# Patient Record
Sex: Female | Born: 1985 | Race: Black or African American | Hispanic: No | Marital: Married | Smoking: Never smoker
Health system: Southern US, Community
[De-identification: ages and names within clinical notes are randomized; demographics above are authoritative.]

## PROBLEM LIST (undated history)

## (undated) DIAGNOSIS — N760 Acute vaginitis: Secondary | ICD-10-CM

## (undated) DIAGNOSIS — B373 Candidiasis of vulva and vagina: Secondary | ICD-10-CM

## (undated) DIAGNOSIS — F419 Anxiety disorder, unspecified: Secondary | ICD-10-CM

## (undated) DIAGNOSIS — B3731 Acute candidiasis of vulva and vagina: Secondary | ICD-10-CM

## (undated) DIAGNOSIS — B9689 Other specified bacterial agents as the cause of diseases classified elsewhere: Secondary | ICD-10-CM

## (undated) DIAGNOSIS — F909 Attention-deficit hyperactivity disorder, unspecified type: Secondary | ICD-10-CM

## (undated) DIAGNOSIS — F319 Bipolar disorder, unspecified: Secondary | ICD-10-CM

## (undated) HISTORY — DX: Candidiasis of vulva and vagina: B37.3

## (undated) HISTORY — DX: Anxiety disorder, unspecified: F41.9

## (undated) HISTORY — DX: Acute candidiasis of vulva and vagina: B37.31

## (undated) HISTORY — DX: Acute vaginitis: N76.0

## (undated) HISTORY — DX: Attention-deficit hyperactivity disorder, unspecified type: F90.9

## (undated) HISTORY — DX: Other specified bacterial agents as the cause of diseases classified elsewhere: B96.89

## (undated) HISTORY — DX: Bipolar disorder, unspecified: F31.9

---

## 2007-03-07 DIAGNOSIS — O24419 Gestational diabetes mellitus in pregnancy, unspecified control: Secondary | ICD-10-CM

## 2012-10-07 DIAGNOSIS — Z91030 Bee allergy status: Secondary | ICD-10-CM | POA: Insufficient documentation

## 2013-02-11 ENCOUNTER — Ambulatory Visit: Payer: Self-pay | Admitting: Family Medicine

## 2013-03-21 DIAGNOSIS — M79602 Pain in left arm: Secondary | ICD-10-CM | POA: Insufficient documentation

## 2014-12-23 ENCOUNTER — Emergency Department
Admission: EM | Admit: 2014-12-23 | Discharge: 2014-12-23 | Disposition: A | Discharge status: Home / Self Care | Payer: Worker's Compensation | Attending: Emergency Medicine | Admitting: Emergency Medicine

## 2014-12-23 ENCOUNTER — Encounter: Payer: Self-pay | Admitting: Emergency Medicine

## 2014-12-23 ENCOUNTER — Other Ambulatory Visit: Payer: Self-pay

## 2014-12-23 DIAGNOSIS — T63441A Toxic effect of venom of bees, accidental (unintentional), initial encounter: Secondary | ICD-10-CM | POA: Insufficient documentation

## 2014-12-23 DIAGNOSIS — Y9289 Other specified places as the place of occurrence of the external cause: Secondary | ICD-10-CM | POA: Insufficient documentation

## 2014-12-23 DIAGNOSIS — T7840XA Allergy, unspecified, initial encounter: Secondary | ICD-10-CM

## 2014-12-23 DIAGNOSIS — Y9389 Activity, other specified: Secondary | ICD-10-CM | POA: Insufficient documentation

## 2014-12-23 DIAGNOSIS — Y990 Civilian activity done for income or pay: Secondary | ICD-10-CM | POA: Diagnosis not present

## 2014-12-23 DIAGNOSIS — T63444A Toxic effect of venom of bees, undetermined, initial encounter: Secondary | ICD-10-CM

## 2014-12-23 MED ORDER — FAMOTIDINE IN NACL 20-0.9 MG/50ML-% IV SOLN
INTRAVENOUS | Status: AC
  Administered 2014-12-23: 20 mg via INTRAVENOUS
  Filled 2014-12-23: qty 50

## 2014-12-23 MED ORDER — PREDNISONE 20 MG PO TABS: ORAL_TABLET | ORAL | Status: DC

## 2014-12-23 MED ORDER — METHYLPREDNISOLONE SODIUM SUCC 125 MG IJ SOLR
INTRAMUSCULAR | Status: AC
  Administered 2014-12-23: 60 mg via INTRAVENOUS
  Filled 2014-12-23: qty 2

## 2014-12-23 MED ORDER — METHYLPREDNISOLONE SODIUM SUCC 125 MG IJ SOLR
60.0000 mg | Freq: Once | INTRAMUSCULAR | Status: AC
  Administered 2014-12-23: 60 mg via INTRAVENOUS

## 2014-12-23 MED ORDER — DIPHENHYDRAMINE HCL 50 MG/ML IJ SOLN
25.0000 mg | Freq: Once | INTRAMUSCULAR | Status: AC
  Administered 2014-12-23: 25 mg via INTRAVENOUS

## 2014-12-23 MED ORDER — DIPHENHYDRAMINE HCL 50 MG/ML IJ SOLN
INTRAMUSCULAR | Status: AC
  Administered 2014-12-23: 25 mg via INTRAVENOUS
  Filled 2014-12-23: qty 1

## 2014-12-23 MED ORDER — FAMOTIDINE IN NACL 20-0.9 MG/50ML-% IV SOLN
20.0000 mg | Freq: Once | INTRAVENOUS | Status: AC
  Administered 2014-12-23: 20 mg via INTRAVENOUS

## 2014-12-23 NOTE — ED Notes (Signed)
Pt reports increased in itching; MD notified, verbal orders given for 25mg  IV benadryl, 20mg  IVPB famotidine and 60mg  IV solumedrol.

## 2014-12-23 NOTE — ED Provider Notes (Signed)
[facility] Emergency Department Provider Note  ____________________________________________  Time seen:    I have reviewed the triage vital signs and the nursing notes.   HISTORY  Chief Complaint Allergic Reaction  Bee sting   HPI Krista Daniels is a 29 y.o. female was stung by a bee prior to arrival. She was working at a middle school setting. The bee stung her on the medial side of her right knee. 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 This prior reaction involved delayed onset tightness and shortness of breath. When she was treated for that staying she was seen in emergency department and it sounds like she was treated with some steroids but without epinephrine. After that particular event she followed up with another physician who provided her with a prescription for an EpiPen in case she got stung again.  Today, when she was stung, she immediately went inside the school. The school had called 911 and obtained their own EpiPen as the patient's EpiPen was in her car. Within 3 minutes the EpiPen was administered. The patient reports she was anxious and a little bit short of breath before the EpiPen was given, but she was not having more severe shortness of breath, she had no diffuse rash, she was not having difficulty speaking, she is not having difficulty swallowing, she did not feel like she was given a pass out, and she did not feel any chills or rigors.  Afternoon illustration of the EpiPen by school staff, 911 arrived and transported to the emergency department. At this time she still does not have any sort of a rash. She feels a little jittery but otherwise fine.   History reviewed. No pertinent past medical history.  Allergy to bee stings  There are no active problems to display for this patient.   Past Surgical History  Procedure Laterality Date  . Cesarean section  2008, 2010    Current Outpatient Rx  Name  Route  Sig  Dispense  Refill  .  predniSONE (DELTASONE) 20 MG tablet      Take 2 tablets the first day, then take one tablet a day for 4 more days.   6 tablet   0     Allergies Azithromycin; Bee venom; and Hydrocodone  No family history on file.  Social History History  Substance Use Topics  . Smoking status: Never Smoker   . Smokeless tobacco: Not on file  . Alcohol Use: No    Review of Systems  Constitutional: Negative for fever. ENT: Negative for sore throat. Cardiovascular: Negative for chest pain. Respiratory: Negative for shortness of breath. Gastrointestinal: Negative for abdominal pain, vomiting and diarrhea. Genitourinary: Negative for dysuria. Musculoskeletal: No myalgias or injuries. Skin: Bee sting with mild to moderate swelling on the right medial knee. See history of present illness Neurological: Negative for headaches   10-point ROS otherwise negative.  ____________________________________________   PHYSICAL EXAM:  VITAL SIGNS: ED Triage Vitals  Enc Vitals Group     BP 12/23/14 1156 132/59 mmHg     Pulse Rate 12/23/14 1156 76     Resp 12/23/14 1156 18     Temp 12/23/14 1156 97.4 F (36.3 C)     Temp Source 12/23/14 1156 Oral     SpO2 12/23/14 1154 99 %     Weight 12/23/14 1156 125 lb (56.7 kg)     Height 12/23/14 1156  (1.499 m)     Head Cir --  Peak Flow --      Pain Score 12/23/14 1157 0     Pain Loc --      Pain Edu? --      Excl. in GC? --     Constitutional:  Alert and oriented. Well appearing and in no distress. ENT   Head: Normocephalic and atraumatic.   Nose: No congestion/rhinnorhea.   Mouth/Throat: Mucous membranes are moist. Cardiovascular: Normal rate of 75, regular rhythm, no murmur noted Respiratory:  Normal respiratory effort, no tachypnea.    Breath sounds are clear and equal bilaterally.  Gastrointestinal: Soft and nontender. No distention.  Back: No muscle spasm, no tenderness, no CVA tenderness. Musculoskeletal: No deformity  noted. Nontender with normal range of motion in all extremities.  No noted edema. Neurologic:  Normal speech and language. No gross focal neurologic deficits are appreciated.  Skin:  Mild swelling to the medial right knee. Minimal erythema. There is no urticaria or other rash noted.Marland Kitchen Psychiatric: Mood and affect are normal. Speech and behavior are normal.  ____________________________________________   EKG  ED ECG REPORT I, Zinedine Ellner W, the attending physician, personally viewed and interpreted this ECG.   Date: 12/23/2014  EKG Time: 1202  Rate: 75  Rhythm: Normal sinus rhythm  Axis: Normal  Intervals: Normal  ST&T Change: None noted   ____________________________________________   ____________________________________________   INITIAL IMPRESSION / ASSESSMENT AND PLAN / ED COURSE  Pertinent labs & imaging results that were available during my care of the patient were reviewed by me and considered in my medical decision making (see chart for details).   Well-appearing 29 year old female in no acute distress. Bee sting to the right medial knee. Currently no sign of a systemic or anaphylactic reaction. We will treat her with Benadryl and famotidine and observe her in the emergency department.  ----------------------------------------- 1:01 PM on 12/23/2014 -----------------------------------------  Patient is beginning to develop itching diffusely. We will add Solu-Medrol to her treatment regimen.  ----------------------------------------- 3:19 PM on 12/23/2014 -----------------------------------------  Patient appears to be doing well. She is sleeping when I first walked in the room. She awakens appropriately. Her heart rate is normal. She has no ongoing rash. We spoke further about when to use epi.  I will prescribe prednisone for the next few days. She can follow-up with her primary physician, Dr. Greggory Stallion, at [facility] primary  care.  ____________________________________________   FINAL CLINICAL IMPRESSION(S) / ED DIAGNOSES  Final diagnoses:  Bee sting reaction, undetermined intent, initial encounter  Allergic reaction, initial encounter      Darien Ramus, MD 12/23/14 1527

## 2014-12-23 NOTE — ED Notes (Signed)
Pt here from job; was stung by a bee to right inner thigh; EMS reports pt got IM epi at the school. Pt alert and oriented, reports feeling tired.

## 2014-12-23 NOTE — Discharge Instructions (Signed)
You're reaction overall appeared mild today. As we discussed, if he gets stung again use she views epinephrine if you are having signs of it affecting her whole body. This could involve itching all over, shortness of breath, difficulty swallowing, chills, weakness, or other general bad feelings. If in doubt, it is better to use the epinephrine early.  Today, it wasn't clear if he needed to take the epinephrine when he did. Take prednisone as prescribed. He may use more Benadryl today if needed for itching. Follow-up with your regular doctor at [facility] primary care. Return to the emergency department if you have shortness of breath, difficult swallowing, or other urgent concerns.  Anaphylactic Reaction An anaphylactic reaction is a sudden, severe allergic reaction that involves the whole body. It can be life threatening. A hospital stay is often required. People with asthma, eczema, or hay fever are slightly more likely to have an anaphylactic reaction. CAUSES  An anaphylactic reaction may be caused by anything to which you are allergic. After being exposed to the allergic substance, your immune system becomes sensitized to it. When you are exposed to that allergic substance again, an allergic reaction can occur. Common causes of an anaphylactic reaction include:  Medicines.  Foods, especially peanuts, wheat, shellfish, milk, and eggs.  Insect bites or stings.  Blood products.  Chemicals, such as dyes, latex, and contrast material used for imaging tests. SYMPTOMS  When an allergic reaction occurs, the body releases histamine and other substances. These substances cause symptoms such as tightening of the airway. Symptoms often develop within seconds or minutes of exposure. Symptoms may include:  Skin rash or hives.  Itching.  Chest tightness.  Swelling of the eyes, tongue, or lips.  Trouble breathing or swallowing.  Lightheadedness or fainting.  Anxiety or confusion.  Stomach pains,  vomiting, or diarrhea.  Nasal congestion.  A fast or irregular heartbeat (palpitations). DIAGNOSIS  Diagnosis is based on your history of recent exposure to allergic substances, your symptoms, and a physical exam. Your caregiver may also perform blood or urine tests to confirm the diagnosis. TREATMENT  Epinephrine medicine is the main treatment for an anaphylactic reaction. Other medicines that may be used for treatment include antihistamines, steroids, and albuterol. In severe cases, fluids and medicine to support blood pressure may be given through an intravenous line (IV). Even if you improve after treatment, you need to be observed to make sure your condition does not get worse. This may require a stay in the hospital. [place] a medical alert bracelet or necklace stating your allergy.  You and your family must learn how to use an anaphylaxis kit or give an epinephrine injection to temporarily treat an emergency allergic reaction. Always carry your epinephrine injection or anaphylaxis kit with you. This can be lifesaving if you have a severe reaction.  Do not drive or perform tasks after treatment until the medicines used to treat your reaction have worn off, or until your caregiver says it is okay.  If you have hives or a rash:  Take medicines as directed by your caregiver.  You may use an over-the-counter antihistamine (diphenhydramine) as needed.  Apply cold compresses to the skin or take baths in cool water. Avoid hot baths or showers. SEEK MEDICAL CARE IF:   You develop symptoms of an allergic reaction to a new substance. Symptoms may start right away or minutes later.  You develop a rash, hives, or itching.  You develop new symptoms. [place]  CARE IF:   You have swelling of the mouth, difficulty breathing, or wheezing.  You have a tight feeling in your chest or throat.  You develop hives, swelling, or itching all over your body.  You  develop severe vomiting or diarrhea.  You feel faint or pass out. This is an emergency. Use your epinephrine injection or anaphylaxis kit as you have been instructed. Call your local emergency services (911 in [place] Even if you improve after the injection, you need to be examined at a hospital emergency department. MAKE SURE YOU:   Understand these instructions.  Will watch your condition.  Will get help right away if you are not doing well or get worse. Document Released: 06/19/2005 Document Revised: 06/24/2013 Document Reviewed: 09/20/2011 [facility] Patient Information 2015 [place], [place]. This information is not intended to replace advice given to you by your health care provider. Make sure you discuss any questions you have with your health care provider.

## 2014-12-25 ENCOUNTER — Ambulatory Visit
Admission: EM | Admit: 2014-12-25 | Discharge: 2014-12-25 | Disposition: A | Discharge status: Home / Self Care | Payer: Worker's Compensation | Attending: Family Medicine | Admitting: Family Medicine

## 2014-12-25 ENCOUNTER — Encounter: Payer: Self-pay | Admitting: Emergency Medicine

## 2014-12-25 DIAGNOSIS — B349 Viral infection, unspecified: Secondary | ICD-10-CM | POA: Diagnosis not present

## 2014-12-25 NOTE — ED Notes (Addendum)
Bee sting on back of right leg on Wednesday. Now having a fever 101.0, cold intolerance, mild headache. Has taken Tylenol and Prednisone. Was taken by EMS to [facility] ER for evaluation.

## 2014-12-26 NOTE — ED Provider Notes (Signed)
CSN: [id number]     Arrival date & time 12/25/14  1349 History   First MD Initiated Contact with Patient 12/25/14 1452     Chief Complaint  Patient presents with  . Insect Bite   (Consider location/radiation/quality/duration/timing/severity/associated sxs/prior Treatment) HPI Comments: 29 yo female presents with a complaint of fever that started today. States 2 days ago (Wed) was stung by a bee, to which patient is allergic, and was taken to hospital ED. Patient was treated and sent home on prednisone which she's been taking. States has been doing well, except feeling fatigued. Today was at work and wasn't feeling well, tired, and noticed to have fever of 101. Denies any other symptoms. Denies swelling, shortness of breath, cough, sore throat, chills, nausea/vomiting, dysuria, runny nose, congestion, skin rashes or lesions.  The history is provided by the patient.    History reviewed. No pertinent past medical history. Past Surgical History  Procedure Laterality Date  . Cesarean section  2008, 2010   No family history on file. History  Substance Use Topics  . Smoking status: Never Smoker   . Smokeless tobacco: Not on file  . Alcohol Use: No   OB History    No data available     Review of Systems  Allergies  Azithromycin; Bee venom; and Hydrocodone  Home Medications   Prior to Admission medications   Medication Sig Start Date End Date Taking? Authorizing Provider  predniSONE (DELTASONE) 20 MG tablet Take 2 tablets the first day, then take one tablet a day for 4 more days. 12/23/14  Yes Darien Ramus, MD   BP 112/55 mmHg  Pulse 100  Temp(Src) 97.9 F (36.6 C) (Tympanic)  Ht 4\' 11"  (1.499 m)  Wt 125 lb (56.7 kg)  BMI 25.23 kg/m2  SpO2 100%  LMP 12/22/2014 Physical Exam  Constitutional: She is oriented to person, place, and time. She appears well-developed and well-nourished. No distress.  HENT:  Head: Normocephalic.  Right Ear: Tympanic membrane, external ear and  ear canal normal.  Left Ear: Tympanic membrane, external ear and ear canal normal.  Nose: Nose normal.  Mouth/Throat: Oropharynx is clear and moist and mucous membranes are normal.  Eyes: Conjunctivae and EOM are normal. Pupils are equal, round, and reactive to light. Right eye exhibits no discharge. Left eye exhibits no discharge. No scleral icterus.  Neck: Normal range of motion. Neck supple. No JVD present. No tracheal deviation present. No thyromegaly present.  Cardiovascular: Normal rate, regular rhythm, normal heart sounds and intact distal pulses.   No murmur heard. Pulmonary/Chest: Effort normal and breath sounds normal. No stridor. No respiratory distress. She has no wheezes. She has no rales. She exhibits no tenderness.  Abdominal: Soft. Bowel sounds are normal. She exhibits no distension and no mass. There is no tenderness. There is no rebound and no guarding.  Musculoskeletal: She exhibits no edema.  Lymphadenopathy:    She has no cervical adenopathy.  Neurological: She is alert and oriented to person, place, and time.  Skin: Skin is warm and dry. No rash noted. She is not diaphoretic. No erythema. No pallor.  Vitals reviewed.   ED Course  Procedures (including critical care time) Labs Review Labs Reviewed - No data to display  Imaging Review No results found.   MDM   1. Viral syndrome   (likely)  Plan: 1. diagnosis reviewed with patient 2. Recommend continue prescribed medications  3. Recommend supportive treatment with increased fluids, rest, otc analgesics prn 4. Monitor for any  developing symptoms 4. F/u prn if symptoms worsen or don't improve    Payton Mccallum, MD 12/26/14 [phone]

## 2015-10-17 ENCOUNTER — Ambulatory Visit (INDEPENDENT_AMBULATORY_CARE_PROVIDER_SITE_OTHER): Payer: BC Managed Care – PPO

## 2015-10-17 ENCOUNTER — Encounter: Payer: Self-pay | Admitting: Gynecology

## 2015-10-17 ENCOUNTER — Ambulatory Visit
Admission: EM | Admit: 2015-10-17 | Discharge: 2015-10-17 | Disposition: A | Discharge status: Home / Self Care | Payer: BC Managed Care – PPO | Attending: Family Medicine | Admitting: Family Medicine

## 2015-10-17 DIAGNOSIS — S9032XA Contusion of left foot, initial encounter: Secondary | ICD-10-CM

## 2015-10-17 NOTE — ED Provider Notes (Signed)
CSN: [id number]     Arrival date & time 10/17/15  1553 History   None    No chief complaint on file.  (Consider location/radiation/quality/duration/timing/severity/associated sxs/prior Treatment) HPI: Patient states that a Pyrex fell on her left foot earlier today. She denies any other injury. She states there is some swelling and it is difficult to weight-bear without pain. She denies any previous injury to the left foot in the past.  No past medical history on file. Past Surgical History  Procedure Laterality Date  . Cesarean section  2008, 2010   No family history on file. Social History  Substance Use Topics  . Smoking status: Never Smoker   . Smokeless tobacco: Not on file  . Alcohol Use: No   OB History    No data available     Review of Systems: Negative except mentioned above.   Allergies  Azithromycin; Bee venom; and Hydrocodone  Home Medications   Prior to Admission medications   Medication Sig Start Date End Date Taking? Authorizing Provider  predniSONE (DELTASONE) 20 MG tablet Take 2 tablets the first day, then take one tablet a day for 4 more days. 12/23/14   Darien Ramusavid W Kaminski, MD   Meds Ordered and Administered this Visit  Medications - No data to display  There were no vitals taken for this visit. No data found.   Physical Exam    GENERAL: NAD RESP: CTA B CARD: RRR MSK: L Foot- mild forefoot tenderness, mild swelling, no ecchymosis, mildly decreased ROM due to pain, nv intact  NEURO: CN II-XII grossly intact   ED Course  Procedures (including critical care time)  Labs Review Labs Reviewed - No data to display  Imaging Review No results found.   MDM  A/P: L Foot Injury- No fracture noted on x-ray, will treat like a sprain, patient given walking boot,RICE explained, Tylenol/Motrin prn, seek medical attention if symptoms persist or worsen as discussed.   Jolene ProvostKirtida Angelos [place], MD 10/20/15 1911

## 2015-11-04 DIAGNOSIS — M79672 Pain in left foot: Secondary | ICD-10-CM | POA: Insufficient documentation

## 2017-08-10 DIAGNOSIS — G4709 Other insomnia: Secondary | ICD-10-CM | POA: Insufficient documentation

## 2017-08-10 DIAGNOSIS — F411 Generalized anxiety disorder: Secondary | ICD-10-CM | POA: Insufficient documentation

## 2017-08-16 ENCOUNTER — Ambulatory Visit: Payer: BC Managed Care – PPO | Admitting: Obstetrics and Gynecology

## 2017-08-16 ENCOUNTER — Encounter: Payer: Self-pay | Admitting: Obstetrics and Gynecology

## 2017-08-16 VITALS — BP 100/60 | HR 64 | Ht 59.0 in | Wt 135.0 lb

## 2017-08-16 DIAGNOSIS — B9689 Other specified bacterial agents as the cause of diseases classified elsewhere: Secondary | ICD-10-CM

## 2017-08-16 DIAGNOSIS — N760 Acute vaginitis: Secondary | ICD-10-CM

## 2017-08-16 LAB — POCT WET PREP WITH KOH
Clue Cells Wet Prep HPF POC: POSITIVE
KOH PREP POC: POSITIVE — AB
Trichomonas, UA: NEGATIVE
YEAST WET PREP PER HPF POC: NEGATIVE

## 2017-08-16 MED ORDER — CLINDAMYCIN PHOSPHATE 2 % VA CREA: 1.0000 | TOPICAL_CREAM | Freq: Every day | VAGINAL | 0 refills | Status: DC

## 2017-08-16 NOTE — Progress Notes (Signed)
Chief Complaint  Patient presents with  . Vaginitis    HPI:      [name] is a 32 y.o. [id number] who LMP was Patient's last menstrual period was 08/11/2017., presents today for vaginits sx. 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 One Swab culture showed candida and 3 types of BV bacteria 1/18. Pt was treated with cleocin crm one time and terazol wkly for 3 months as maintenance. Pt states this controlled her sx while she was doing it. She notes increased d/c, odor and irritation after menses. She treats with monistat-7 with relief, and then sx recur again after each menses. She uses dove sens skin soap/no dryer sheets/cotton underwear, no thongs/unscented pads.  She is sex active, no new partners.  Past Medical History:  Diagnosis Date  . Anxiety     Past Surgical History:  Procedure Laterality Date  . CESAREAN SECTION  2008, 2010    Family History  Problem Relation Age of Onset  . Breast cancer Maternal Grandmother     Social History   Socioeconomic History  . Marital status: Single    Spouse name: Not on file  . Number of children: Not on file  . Years of education: Not on file  . Highest education level: Not on file  Social Needs  . Financial resource strain: Not on file  . Food insecurity - worry: Not on file  . Food insecurity - inability: Not on file  . Transportation needs - medical: Not on file  . Transportation needs - non-medical: Not on file  Occupational History  . Not on file  Tobacco Use  . Smoking status: Never Smoker  . Smokeless tobacco: Never Used  Substance and Sexual Activity  . Alcohol use: No  . Drug use: No  . Sexual activity: Yes    Birth control/protection: IUD  Other Topics Concern  . Not on file  Social History Narrative  . Not on file     Current Outpatient Medications:  .  clindamycin (CLEOCIN) 2 % vaginal cream, Place 1 Applicatorful vaginally at bedtime., Disp: 40 g, Rfl: 0 .  ibuprofen  (ADVIL,MOTRIN) 600 MG tablet, Take 600 mg by mouth every 6 (six) hours as needed., Disp: , Rfl:  .  levonorgestrel (MIRENA) 20 MCG/24HR IUD, 1 each by Intrauterine route once., Disp: , Rfl:  .  predniSONE (DELTASONE) 20 MG tablet, Take 2 tablets the first day, then take one tablet a day for 4 more days., Disp: 6 tablet, Rfl: 0   ROS:  Review of Systems  Constitutional: Negative for fever.  Gastrointestinal: Negative for blood in stool, constipation, diarrhea, nausea and vomiting.  Genitourinary: Positive for vaginal discharge. Negative for dyspareunia, dysuria, flank pain, frequency, hematuria, urgency, vaginal bleeding and vaginal pain.  Musculoskeletal: Negative for back pain.  Skin: Negative for rash.     OBJECTIVE:   Vitals:  BP 100/60   Pulse 64   Ht 4\' 11"  (1.499 m)   Wt 135 lb (61.2 kg)   LMP 08/11/2017   BMI 27.27 kg/m   Physical Exam  Constitutional: She is oriented to person, place, and time and well-developed, well-nourished, and in no distress. Vital signs are normal.  Genitourinary: Uterus normal, cervix normal, right adnexa normal and left adnexa normal. Uterus is not enlarged. Cervix exhibits no motion tenderness and no tenderness. Right adnexum displays no mass and no tenderness. Left adnexum displays no mass and no tenderness. Vulva exhibits erythema. Vulva exhibits  no exudate, no lesion, no rash and no tenderness. Vagina exhibits no lesion. Thin  odorless  white and vaginal discharge found.  Neurological: She is oriented to person, place, and time.  Vitals reviewed.   Results: Results for orders placed or performed in visit on 08/16/17 (from the past 24 hour(s))  POCT Wet Prep with KOH     Status: Abnormal   Collection Time: 08/16/17 11:05 AM  Result Value Ref Range   Trichomonas, UA Negative    Clue Cells Wet Prep HPF POC pos    Epithelial Wet Prep HPF POC  Few, Moderate, Many, Too numerous to count   Yeast Wet Prep HPF POC neg    Bacteria Wet Prep HPF  POC  Few   RBC Wet Prep HPF POC     WBC Wet Prep HPF POC     KOH Prep POC Positive (A) Negative     Assessment/Plan: Bacterial vaginosis - Plan: POCT Wet Prep with KOH, clindamycin (CLEOCIN) 2 % vaginal cream  Question recurrent BV or yeast or both after menses. Rx cleocin since BV only on wet prep. Pt to f/u with sx. If resolved, will start cleocin once wkly as preventive. If itch persists, will treat for yeast, too.   Meds ordered this encounter  Medications  . clindamycin (CLEOCIN) 2 % vaginal cream    Sig: Place 1 Applicatorful vaginally at bedtime.    Dispense:  40 g    Refill:  0    Order Specific Question:   Supervising Provider    Answer:   Nadara MustardHARRIS, ROBERT P [[id number]]     Return if symptoms worsen or fail to improve.  Jaiven Graveline B. Ehren Berisha, PA-C 08/16/2017 11:10 AM

## 2017-08-16 NOTE — Patient Instructions (Signed)
I value your feedback and entrusting us with your care. If you get a [place] patient survey, I would appreciate you taking the time to let us know about your experience today. Thank you! 

## 2017-10-09 ENCOUNTER — Encounter: Payer: Self-pay | Admitting: Emergency Medicine

## 2017-10-09 ENCOUNTER — Other Ambulatory Visit: Payer: Self-pay

## 2017-10-09 ENCOUNTER — Ambulatory Visit
Admission: EM | Admit: 2017-10-09 | Discharge: 2017-10-09 | Disposition: A | Discharge status: Home / Self Care | Payer: BC Managed Care – PPO | Attending: Family Medicine | Admitting: Family Medicine

## 2017-10-09 DIAGNOSIS — S76312A Strain of muscle, fascia and tendon of the posterior muscle group at thigh level, left thigh, initial encounter: Secondary | ICD-10-CM | POA: Diagnosis not present

## 2017-10-09 DIAGNOSIS — X500XXA Overexertion from strenuous movement or load, initial encounter: Secondary | ICD-10-CM | POA: Diagnosis not present

## 2017-10-09 MED ORDER — MELOXICAM 15 MG PO TABS: 15.0000 mg | ORAL_TABLET | Freq: Every day | ORAL | 0 refills | Status: DC | PRN

## 2017-10-09 NOTE — ED Provider Notes (Signed)
[facility]  CSN: [id number] Arrival date & time: 10/09/17  [id number]  History   Chief Complaint Chief Complaint  Patient presents with  . Hip Pain    left  . thigh pain    left   HPI  32 year old female presents with left hamstring pain.  Patient lifted heavily yesterday.  She states that she was doing 70% of her max on dead lift and squat.  Patient states that this morning she was crawling out of bed and felt a pop in her left hamstring.  Pain is severe.  She reports difficulty ambulating.  She also reports left hip pain.  Worse with activity.  No relieving factors.  No medications or interventions tried.  No other associated symptoms.  No other complaints.  Past Medical History:  Diagnosis Date  . Anxiety   . Anxiety    Past Surgical History:  Procedure Laterality Date  . CESAREAN SECTION  2008, 2010   OB History    Gravida  3   Para  2   Term  2   Preterm      AB  1   Living  2     SAB  1   TAB      Ectopic      Multiple      Live Births             Home Medications    Prior to Admission medications   Medication Sig Start Date End Date Taking? Authorizing Provider  clindamycin (CLEOCIN) 2 % vaginal cream Place 1 Applicatorful vaginally at bedtime. 08/16/17  Yes Copland, Helmut MusterAlicia B, PA-C  escitalopram (LEXAPRO) 10 MG tablet Take 1 tablet by mouth daily. 08/10/17  Yes [provider]  levonorgestrel (MIRENA) 20 MCG/24HR IUD 1 each by Intrauterine route once.   Yes [provider]  meloxicam (MOBIC) 15 MG tablet Take 1 tablet (15 mg total) by mouth daily as needed. 10/09/17   Tommie Samsook, Reeve Mallo G, DO   Family History Family History  Problem Relation Age of Onset  . Breast cancer Maternal Grandmother   . Diabetes Maternal Grandmother   . Healthy Mother   . Heart attack Father    Social History Social History   Tobacco Use  . Smoking status: Never Smoker  . Smokeless tobacco: Never Used  Substance Use Topics  . Alcohol use: Yes      Comment: socially  . Drug use: No   Allergies   Azithromycin; Bee venom; and Hydrocodone   Review of Systems Review of Systems  Constitutional: Negative.   Musculoskeletal:       Left hamstring pain.   Physical Exam Triage Vital Signs ED Triage Vitals  Enc Vitals Group     BP 10/09/17 0850 (!) 108/57     Pulse Rate 10/09/17 0850 62     Resp 10/09/17 0850 16     Temp 10/09/17 0850 98.1 F (36.7 C)     Temp Source 10/09/17 0850 Oral     SpO2 10/09/17 0850 100 %     Weight 10/09/17 0851 130 lb (59 kg)     Height 10/09/17 0851 4\' 11"  (1.499 m)     Head Circumference --      Peak Flow --      Pain Score 10/09/17 0851 4     Pain Loc --      Pain Edu? --      Excl. in GC? --    No data found.  Updated  Vital Signs BP (!) 108/57 (BP Location: Right Arm)   Pulse 62   Temp 98.1 F (36.7 C) (Oral)   Resp 16   Ht 4\' 11"  (1.499 m)   Wt 130 lb (59 kg)   SpO2 100%   BMI 26.26 kg/m   Physical Exam  Constitutional: She is oriented to person, place, and time. She appears well-developed. No distress.  Cardiovascular: Normal rate and regular rhythm.  Pulmonary/Chest: Effort normal and breath sounds normal.  Musculoskeletal:  Left hamstring -exquisitely tender to palpation throughout the muscle.  She is able to fire the muscle.  I cannot appreciate a full-thickness tear.  This is likely a partial tear versus strain.  Neurological: She is alert and oriented to person, place, and time.  Psychiatric: She has a normal mood and affect. Her behavior is normal.  Nursing note and vitals reviewed.  UC Treatments / Results  Labs (all labs ordered are listed, but only abnormal results are displayed) Labs Reviewed - No data to display  EKG None Radiology No results found.  Procedures Procedures (including critical care time)  Medications Ordered in UC Medications - No data to display   Initial Impression / Assessment and Plan / UC Course  I have reviewed the triage vital  signs and the nursing notes.  Pertinent labs & imaging results that were available during my care of the patient were reviewed by me and considered in my medical decision making (see chart for details).     32 year old female presents with hamstring strain.  Treating with meloxicam, rest, ice, elevation.  If fails to improve, she should see my sports medicine colleague in [place].  Final Clinical Impressions(s) / UC Diagnoses   Final diagnoses:  Strain of left hamstring, initial encounter    ED Discharge Orders        Ordered    meloxicam (MOBIC) 15 MG tablet  Daily PRN     10/09/17 0936     Controlled Substance Prescriptions [place] Controlled Substance Registry consulted? Not Applicable   Tommie Sams, [place] 10/09/17 7829

## 2017-10-09 NOTE — ED Triage Notes (Signed)
Patient in today c/o left hip and thigh pain since 4:45am this morning. Patient does power lifting (weights) and did a heavy leg load yesterday.

## 2017-10-09 NOTE — Discharge Instructions (Signed)
Rest, ice, elevation.  Medication as prescribed.  If you fail to improve, go see my colleague.   Take care  Dr. Adriana Simasook

## 2018-01-28 ENCOUNTER — Ambulatory Visit (INDEPENDENT_AMBULATORY_CARE_PROVIDER_SITE_OTHER): Payer: BC Managed Care – PPO | Admitting: Obstetrics and Gynecology

## 2018-01-28 ENCOUNTER — Encounter: Payer: Self-pay | Admitting: Obstetrics and Gynecology

## 2018-01-28 VITALS — BP 108/64 | HR 84 | Ht 59.0 in | Wt 138.0 lb

## 2018-01-28 DIAGNOSIS — B3731 Acute candidiasis of vulva and vagina: Secondary | ICD-10-CM

## 2018-01-28 DIAGNOSIS — B373 Candidiasis of vulva and vagina: Secondary | ICD-10-CM

## 2018-01-28 DIAGNOSIS — N760 Acute vaginitis: Secondary | ICD-10-CM | POA: Diagnosis not present

## 2018-01-28 DIAGNOSIS — B9689 Other specified bacterial agents as the cause of diseases classified elsewhere: Secondary | ICD-10-CM | POA: Diagnosis not present

## 2018-01-28 LAB — POCT WET PREP WITH KOH
CLUE CELLS WET PREP PER HPF POC: POSITIVE
KOH PREP POC: POSITIVE — AB
Trichomonas, UA: NEGATIVE
YEAST WET PREP PER HPF POC: POSITIVE

## 2018-01-28 MED ORDER — FLUCONAZOLE 150 MG PO TABS: 150.0000 mg | ORAL_TABLET | Freq: Once | ORAL | 0 refills | Status: DC

## 2018-01-28 MED ORDER — CLINDAMYCIN PHOSPHATE 2 % VA CREA: TOPICAL_CREAM | VAGINAL | 0 refills | Status: DC

## 2018-01-28 MED ORDER — CLINDAMYCIN HCL 300 MG PO CAPS: 300.0000 mg | ORAL_CAPSULE | Freq: Two times a day (BID) | ORAL | 0 refills | Status: DC

## 2018-01-28 NOTE — Progress Notes (Signed)
Patient, No Pcp Per   Chief Complaint  Patient presents with  . Vaginitis    HPI:      [name] is a 32 y.o. [id number] who LMP was No LMP recorded. (Menstrual status: IUD)., presents today for vaginitis sx again. Pt with hx of recurrent BV and yeast, usually triggered by menses. Pt had BV 2/19 and treated with cleocin with sx relief. Then doing once wkly tx as maintenance with sx relief, until ran out of Rx about 4 wks ago. Noticed increased d/c, itch, odor about 3 wks ago. Is diligent with probiotic use, condoms, dove sens skin soap, no dryer sheets. Has not used any meds to treat with current sx. LMP was 10 days so couldn't do wkly cleocin dose and pt thinks that was a trigger too.  She is sex active, no new partners.  Annual due.   Past Medical History:  Diagnosis Date  . Anxiety   . Anxiety   . BV (bacterial vaginosis)   . Yeast vaginitis     Past Surgical History:  Procedure Laterality Date  . CESAREAN SECTION  2008, 2010    Family History  Problem Relation Age of Onset  . Breast cancer Maternal Grandmother   . Diabetes Maternal Grandmother   . Healthy Mother   . Heart attack Father     Social History   Socioeconomic History  . Marital status: Married    Spouse name: Not on file  . Number of children: Not on file  . Years of education: Not on file  . Highest education level: Not on file  Occupational History  . Not on file  Social Needs  . Financial resource strain: Not on file  . Food insecurity:    Worry: Not on file    Inability: Not on file  . Transportation needs:    Medical: Not on file    Non-medical: Not on file  Tobacco Use  . Smoking status: Never Smoker  . Smokeless tobacco: Never Used  Substance and Sexual Activity  . Alcohol use: Yes    Comment: socially  . Drug use: No  . Sexual activity: Yes    Birth control/protection: IUD  Lifestyle  . Physical activity:    Days per week: Not on file    Minutes per session: Not on file   . Stress: Not on file  Relationships  . Social connections:    Talks on phone: Not on file    Gets together: Not on file    Attends religious service: Not on file    Active member of club or organization: Not on file    Attends meetings of clubs or organizations: Not on file    Relationship status: Not on file  . Intimate partner violence:    Fear of current or ex partner: Not on file    Emotionally abused: Not on file    Physically abused: Not on file    Forced sexual activity: Not on file  Other Topics Concern  . Not on file  Social History Narrative  . Not on file    Outpatient Medications Prior to Visit  Medication Sig Dispense Refill  . doxepin (SINEQUAN) 10 MG capsule Take 1 capsule by mouth at bedtime.    Marland Kitchen. escitalopram (LEXAPRO) 10 MG tablet Take 1 tablet by mouth daily.  2  . levonorgestrel (MIRENA) 20 MCG/24HR IUD 1 each by Intrauterine route once.    . clindamycin (CLEOCIN) 2 % vaginal cream  Place 1 Applicatorful vaginally at bedtime. (Patient not taking: Reported on 01/28/2018) 40 g 0  . meloxicam (MOBIC) 15 MG tablet Take 1 tablet (15 mg total) by mouth daily as needed. 30 tablet 0   No facility-administered medications prior to visit.       ROS:  Review of Systems  Constitutional: Negative for fever.  Gastrointestinal: Negative for blood in stool, constipation, diarrhea, nausea and vomiting.  Genitourinary: Positive for vaginal discharge. Negative for dyspareunia, dysuria, flank pain, frequency, hematuria, urgency, vaginal bleeding and vaginal pain.  Musculoskeletal: Negative for back pain.  Skin: Negative for rash.   BREAST: No symptoms   OBJECTIVE:   Vitals:  BP 108/64 (BP Location: Left Arm, Patient Position: Sitting, Cuff Size: Normal)   Pulse 84   Ht 4\' 11"  (1.499 m)   Wt 138 lb (62.6 kg)   SpO2 98%   BMI 27.87 kg/m   Physical Exam  Constitutional: She is oriented to person, place, and time. Vital signs are normal. She appears  well-developed.  Pulmonary/Chest: Effort normal.  Genitourinary: Uterus normal. There is no rash, tenderness or lesion on the right labia. There is no rash, tenderness or lesion on the left labia. Uterus is not enlarged and not tender. Cervix exhibits no motion tenderness. Right adnexum displays no mass and no tenderness. Left adnexum displays no mass and no tenderness. No erythema or tenderness in the vagina. Vaginal discharge found.  Musculoskeletal: Normal range of motion.  Neurological: She is alert and oriented to person, place, and time.  Psychiatric: She has a normal mood and affect. Her behavior is normal. Thought content normal.  Vitals reviewed.   Results: Results for orders placed or performed in visit on 01/28/18 (from the past 24 hour(s))  POCT Wet Prep with KOH     Status: Abnormal   Collection Time: 01/28/18 10:58 AM  Result Value Ref Range   Trichomonas, UA Negative    Clue Cells Wet Prep HPF POC pos    Epithelial Wet Prep HPF POC  Few, Moderate, Many, Too numerous to count   Yeast Wet Prep HPF POC pos    Bacteria Wet Prep HPF POC  Few   RBC Wet Prep HPF POC     WBC Wet Prep HPF POC     KOH Prep POC Positive (A) Negative     Assessment/Plan: Candidal vaginitis - Pos wet prep/sx. Rx diflucan. F/u prn. - Plan: POCT Wet Prep with KOH, fluconazole (DIFLUCAN) 150 MG tablet  Bacterial vaginosis - Plan: POCT Wet Prep with KOH, clindamycin (CLEOCIN) 2 % vaginal cream, clindamycin (CLEOCIN) 300 MG capsule  Pos wet prep/sx. Rx clindamycin oral BID for 7 days since menses about to start. Then resume cleocin crm QWK as preventive. Use leftover cleocin pills as maintenance if menses long and can't use crm.   Meds ordered this encounter  Medications  . fluconazole (DIFLUCAN) 150 MG tablet    Sig: Take 1 tablet (150 mg total) by mouth once for 1 dose.    Dispense:  1 tablet    Refill:  0    Order Specific Question:   Supervising Provider    Answer:   Nadara Mustard [id number]    . clindamycin (CLEOCIN) 2 % vaginal cream    Sig: Apply 1 applicatorful vaginally once wkly for 3 months as maintenance    Dispense:  40 g    Refill:  0    Order Specific Question:   Supervising Provider    Answer:  Nadara Mustard [[id number]]  . clindamycin (CLEOCIN) 300 MG capsule    Sig: Take 1 capsule (300 mg total) by mouth 2 (two) times daily for 10 days.    Dispense:  20 capsule    Refill:  0    Order Specific Question:   Supervising Provider    Answer:   Nadara Mustard [[id number]]      Return if symptoms worsen or fail to improve.  Chiara Coltrin B. Aarthi Uyeno, PA-C 01/28/2018 11:01 AM

## 2018-01-28 NOTE — Patient Instructions (Signed)
I value your feedback and entrusting us with your care. If you get a [facility] patient survey, I would appreciate you taking the time to let us know about your experience today. Thank you! 

## 2018-04-14 ENCOUNTER — Other Ambulatory Visit: Payer: Self-pay | Admitting: Obstetrics and Gynecology

## 2018-04-14 ENCOUNTER — Encounter: Payer: Self-pay | Admitting: Obstetrics and Gynecology

## 2018-04-14 DIAGNOSIS — B9689 Other specified bacterial agents as the cause of diseases classified elsewhere: Secondary | ICD-10-CM

## 2018-04-14 DIAGNOSIS — N760 Acute vaginitis: Principal | ICD-10-CM

## 2018-04-15 ENCOUNTER — Other Ambulatory Visit: Payer: Self-pay | Admitting: Obstetrics and Gynecology

## 2018-04-15 DIAGNOSIS — B3731 Acute candidiasis of vulva and vagina: Secondary | ICD-10-CM

## 2018-04-15 DIAGNOSIS — B373 Candidiasis of vulva and vagina: Secondary | ICD-10-CM

## 2018-04-15 MED ORDER — FLUCONAZOLE 150 MG PO TABS: 150.0000 mg | ORAL_TABLET | Freq: Once | ORAL | 0 refills | Status: AC

## 2018-04-15 MED ORDER — CLINDAMYCIN PHOSPHATE 2 % VA CREA: TOPICAL_CREAM | VAGINAL | 0 refills | Status: DC

## 2018-04-15 NOTE — Progress Notes (Signed)
Rx RF. 

## 2018-04-15 NOTE — Telephone Encounter (Signed)
Please advise 

## 2018-05-06 ENCOUNTER — Ambulatory Visit: Payer: BC Managed Care – PPO | Admitting: Obstetrics and Gynecology

## 2018-06-04 ENCOUNTER — Encounter: Payer: Self-pay | Admitting: Obstetrics and Gynecology

## 2018-06-05 ENCOUNTER — Other Ambulatory Visit: Payer: Self-pay | Admitting: Obstetrics and Gynecology

## 2018-06-05 DIAGNOSIS — B9689 Other specified bacterial agents as the cause of diseases classified elsewhere: Secondary | ICD-10-CM

## 2018-06-05 DIAGNOSIS — N760 Acute vaginitis: Principal | ICD-10-CM

## 2018-06-05 MED ORDER — CLINDAMYCIN HCL 300 MG PO CAPS: 300.0000 mg | ORAL_CAPSULE | Freq: Two times a day (BID) | ORAL | 0 refills | Status: AC

## 2018-06-05 NOTE — Progress Notes (Signed)
Rx RF clindamycin for BV sx 

## 2018-06-27 ENCOUNTER — Other Ambulatory Visit: Payer: Self-pay

## 2018-06-27 ENCOUNTER — Ambulatory Visit (INDEPENDENT_AMBULATORY_CARE_PROVIDER_SITE_OTHER): Payer: BC Managed Care – PPO | Admitting: Obstetrics and Gynecology

## 2018-06-27 ENCOUNTER — Encounter: Payer: Self-pay | Admitting: Obstetrics and Gynecology

## 2018-06-27 ENCOUNTER — Other Ambulatory Visit (HOSPITAL_COMMUNITY)
Admission: RE | Admit: 2018-06-27 | Discharge: 2018-06-27 | Disposition: A | Discharge status: Home / Self Care | Payer: BC Managed Care – PPO | Source: Ambulatory Visit | Attending: Obstetrics and Gynecology | Admitting: Obstetrics and Gynecology

## 2018-06-27 VITALS — BP 132/60 | HR 57 | Ht 59.0 in | Wt 126.0 lb

## 2018-06-27 DIAGNOSIS — N939 Abnormal uterine and vaginal bleeding, unspecified: Secondary | ICD-10-CM

## 2018-06-27 DIAGNOSIS — Z01419 Encounter for gynecological examination (general) (routine) without abnormal findings: Secondary | ICD-10-CM

## 2018-06-27 DIAGNOSIS — Z124 Encounter for screening for malignant neoplasm of cervix: Secondary | ICD-10-CM | POA: Diagnosis not present

## 2018-06-27 DIAGNOSIS — B9689 Other specified bacterial agents as the cause of diseases classified elsewhere: Secondary | ICD-10-CM

## 2018-06-27 DIAGNOSIS — Z30431 Encounter for routine checking of intrauterine contraceptive device: Secondary | ICD-10-CM

## 2018-06-27 DIAGNOSIS — N760 Acute vaginitis: Secondary | ICD-10-CM

## 2018-06-27 MED ORDER — SECNIDAZOLE 2 G PO PACK: 2.0000 g | PACK | Freq: Once | ORAL | 0 refills | Status: AC

## 2018-06-27 MED ORDER — METRONIDAZOLE 0.75 % VA GEL: 1.0000 | Freq: Every day | VAGINAL | 5 refills | Status: DC

## 2018-06-27 NOTE — Progress Notes (Signed)
Gynecology Annual Exam   PCP: Patient, No Pcp Per  Chief Complaint:  Chief Complaint  Patient presents with  . Gynecologic Exam    Recurrent BV & Yeast past few months. Also 15 day menses w/IUD    History of Present Illness: Patient is a 32 y.o. [id number] presents for annual exam. The patient has no complaints today.   LMP: Patient's last menstrual period was 06/12/2018. Irregular menstrual cycles on IUD  The patient is sexually active. She currently uses IUD for contraception. She denies dyspareunia.    There is no notable family history of breast or ovarian cancer in her family.  The patient wears seatbelts: yes.   The patient has regular exercise: not asked.    The patient denies current symptoms of depression.    Review of Systems: Review of Systems  Constitutional: Negative for chills and fever.  HENT: Negative for congestion.   Respiratory: Negative for cough and shortness of breath.   Cardiovascular: Negative for chest pain and palpitations.  Gastrointestinal: Negative for abdominal pain, constipation, diarrhea, heartburn, nausea and vomiting.  Genitourinary: Negative for dysuria, frequency and urgency.  Skin: Negative for itching and rash.  Neurological: Negative for dizziness and headaches.  Endo/Heme/Allergies: Negative for polydipsia.  Psychiatric/Behavioral: Negative for depression.    Past Medical History:  Past Medical History:  Diagnosis Date  . Anxiety   . Anxiety   . BV (bacterial vaginosis)   . Yeast vaginitis     Past Surgical History:  Past Surgical History:  Procedure Laterality Date  . CESAREAN SECTION  2008, 2010    Gynecologic History:  Patient's last menstrual period was 06/12/2018. Contraception: IUD Mirena 05/13/2014  Obstetric History: [id number]  Family History:  Family History  Problem Relation Age of Onset  . Breast cancer Maternal Grandmother   . Diabetes Maternal Grandmother   . Healthy Mother   . Heart attack Father     . Colon cancer Maternal Grandfather [id number]  . Breast cancer Maternal Great-grandmother [id number]    Social History:  Social History   Socioeconomic History  . Marital status: Married    Spouse name: Not on file  . Number of children: Not on file  . Years of education: Not on file  . Highest education level: Not on file  Occupational History  . Not on file  Social Needs  . Financial resource strain: Not on file  . Food insecurity:    Worry: Not on file    Inability: Not on file  . Transportation needs:    Medical: Not on file    Non-medical: Not on file  Tobacco Use  . Smoking status: Never Smoker  . Smokeless tobacco: Never Used  Substance and Sexual Activity  . Alcohol use: Yes    Comment: socially  . Drug use: No  . Sexual activity: Yes    Birth control/protection: I.U.D.  Lifestyle  . Physical activity:    Days per week: 5 days    Minutes per session: 50 min  . Stress: Not on file  Relationships  . Social connections:    Talks on phone: Not on file    Gets together: Not on file    Attends religious service: Not on file    Active member of club or organization: Not on file    Attends meetings of clubs or organizations: Not on file    Relationship status: Not on file  . Intimate partner violence:    Fear of current or ex  partner: Not on file    Emotionally abused: Not on file    Physically abused: Not on file    Forced sexual activity: Not on file  Other Topics Concern  . Not on file  Social History Narrative  . Not on file    Allergies:  Allergies  Allergen Reactions  . Azithromycin   . Bee Venom   . Hydrocodone     Medications: Prior to Admission medications   Medication Sig Start Date End Date Taking? Authorizing Provider  clindamycin (CLEOCIN) 2 % vaginal cream Apply 1 applicatorful vaginally once wkly for 3 months as maintenance 04/15/18   Copland, Alicia B, PA-C  doxepin (SINEQUAN) 10 MG capsule Take 1 capsule by mouth at bedtime. 08/31/17 08/31/18   [provider]  escitalopram (LEXAPRO) 10 MG tablet Take 1 tablet by mouth daily. 08/10/17   [provider]  levonorgestrel (MIRENA) 20 MCG/24HR IUD 1 each by Intrauterine route once.    [provider]    Physical Exam Vitals: Blood pressure 132/60, pulse (!) 57, height 4\' 11"  (1.499 m), weight 126 lb (57.2 kg), last menstrual period 06/12/2018.   General: NAD HEENT: normocephalic, anicteric Thyroid: no enlargement, no palpable nodules Pulmonary: No increased work of breathing, CTAB Cardiovascular: RRR, distal pulses 2+ Breast: Breast symmetrical, no tenderness, no palpable nodules or masses, no skin or nipple retraction present, no nipple discharge.  No axillary or supraclavicular lymphadenopathy. Abdomen: NABS, soft, non-tender, non-distended.  Umbilicus without lesions.  No hepatomegaly, splenomegaly or masses palpable. No evidence of hernia  Genitourinary:  External: Normal external female genitalia.  Normal urethral meatus, normal Bartholin's and Skene's glands.    Vagina: Normal vaginal mucosa, no evidence of prolapse.    Cervix: Grossly normal in appearance, no bleeding, IUD string visualized  Uterus: Non-enlarged, mobile, normal contour.  No CMT  Adnexa: ovaries non-enlarged, no adnexal masses  Rectal: deferred  Lymphatic: no evidence of inguinal lymphadenopathy Extremities: no edema, erythema, or tenderness Neurologic: Grossly intact Psychiatric: mood appropriate, affect full  Female chaperone present for pelvic and breast  portions of the physical exam    Assessment: 32 y.o. [id number] routine annual exam  Plan: Problem List Items Addressed This Visit    None    Visit Diagnoses    Encounter for gynecological examination without abnormal finding    -  Primary   Screening for malignant neoplasm of cervix       Relevant Orders   Cytology - PAP   Recurrent vaginitis       Bacterial vaginosis       IUD check up       Relevant Orders   US  Transvaginal Non-OB   Abnormal uterine bleeding       Relevant Orders   US Transvaginal Non-OB      1) STI screening  was notoffered and therefore not obtained  2)  ASCCP guidelines and rational discussed.  Patient opts for every 3 years screening interval  3) Contraception - the patient is currently using  IUD.  She is happy with her current form of contraception and plans to continue - will check IUD location given continued irregular bleeding  4) Routine healthcare maintenance including cholesterol, diabetes screening discussed managed by PCP  5) Recurrent BV - silosec and metronidazole (has tried clinda gel)  6) Return in about 2 weeks (around 07/11/2018) for 2-3 week GYN US and follow up [place].   Vena AustriaAndreas Haniah Penny, MD, Merlinda FrederickFACOG [facility], [facility] 06/27/2018, 3:30  PM

## 2018-06-28 ENCOUNTER — Ambulatory Visit: Payer: BC Managed Care – PPO | Admitting: Obstetrics and Gynecology

## 2018-07-04 LAB — CYTOLOGY - PAP
ADEQUACY: ABSENT
DIAGNOSIS: NEGATIVE
HPV: NOT DETECTED

## 2018-07-09 ENCOUNTER — Encounter: Payer: Self-pay | Admitting: Obstetrics and Gynecology

## 2018-07-09 ENCOUNTER — Ambulatory Visit (INDEPENDENT_AMBULATORY_CARE_PROVIDER_SITE_OTHER): Payer: BC Managed Care – PPO | Admitting: Obstetrics and Gynecology

## 2018-07-09 ENCOUNTER — Ambulatory Visit (INDEPENDENT_AMBULATORY_CARE_PROVIDER_SITE_OTHER): Payer: BC Managed Care – PPO

## 2018-07-09 VITALS — BP 128/74 | Wt 122.0 lb

## 2018-07-09 DIAGNOSIS — Z30431 Encounter for routine checking of intrauterine contraceptive device: Secondary | ICD-10-CM

## 2018-07-09 DIAGNOSIS — N8000 Endometriosis of the uterus, unspecified: Secondary | ICD-10-CM

## 2018-07-09 DIAGNOSIS — N939 Abnormal uterine and vaginal bleeding, unspecified: Secondary | ICD-10-CM | POA: Diagnosis not present

## 2018-07-09 MED ORDER — ESTROGENS CONJUGATED 1.25 MG PO TABS: 1.2500 mg | ORAL_TABLET | Freq: Every day | ORAL | 0 refills | Status: DC

## 2018-07-09 MED ORDER — SECNIDAZOLE 2 G PO PACK: 2.0000 g | PACK | Freq: Once | ORAL | 0 refills | Status: DC

## 2018-07-09 NOTE — Progress Notes (Signed)
Gynecology Ultrasound Follow Up  Chief Complaint:  Chief Complaint  Patient presents with  . Follow-up    GYN Ultrasound     History of Present Illness: Patient is a 33 y.o. female who presents today for ultrasound evaluation of IUD location given AUB .  Ultrasound demonstrates the following findgins Adnexa: no masses seen  Uterus: Non-enlarged with endometrial stripe without focal abnormalities and 3.63mm Additional: No free fluid, IUD in proper location with proper deployment of both arms  Review of Systems: Review of Systems  Constitutional: Negative.   Genitourinary: Negative.     Past Medical History:  Past Medical History:  Diagnosis Date  . Anxiety   . Anxiety   . BV (bacterial vaginosis)   . Yeast vaginitis     Past Surgical History:  Past Surgical History:  Procedure Laterality Date  . CESAREAN SECTION  2008, 2010    Gynecologic History:  Patient's last menstrual period was 06/12/2018.  Family History:  Family History  Problem Relation Age of Onset  . Breast cancer Maternal Grandmother   . Diabetes Maternal Grandmother   . Healthy Mother   . Heart attack Father   . Colon cancer Maternal Grandfather 96  . Breast cancer Maternal Great-grandmother 49    Social History:  Social History   Socioeconomic History  . Marital status: Married    Spouse name: Not on file  . Number of children: Not on file  . Years of education: Not on file  . Highest education level: Not on file  Occupational History  . Not on file  Social Needs  . Financial resource strain: Not on file  . Food insecurity:    Worry: Not on file    Inability: Not on file  . Transportation needs:    Medical: Not on file    Non-medical: Not on file  Tobacco Use  . Smoking status: Never Smoker  . Smokeless tobacco: Never Used  Substance and Sexual Activity  . Alcohol use: Yes    Comment: socially  . Drug use: No  . Sexual activity: Yes    Birth control/protection: I.U.D.    Lifestyle  . Physical activity:    Days per week: 5 days    Minutes per session: 50 min  . Stress: Not on file  Relationships  . Social connections:    Talks on phone: Not on file    Gets together: Not on file    Attends religious service: Not on file    Active member of club or organization: Not on file    Attends meetings of clubs or organizations: Not on file    Relationship status: Not on file  . Intimate partner violence:    Fear of current or ex partner: Not on file    Emotionally abused: Not on file    Physically abused: Not on file    Forced sexual activity: Not on file  Other Topics Concern  . Not on file  Social History Narrative  . Not on file    Allergies:  Allergies  Allergen Reactions  . Azithromycin   . Bee Venom   . Hydrocodone     Medications: Prior to Admission medications   Medication Sig Start Date End Date Taking? Authorizing Provider  doxepin (SINEQUAN) 10 MG capsule Take 1 capsule by mouth at bedtime. 08/31/17 08/31/18  [provider]  escitalopram (LEXAPRO) 10 MG tablet Take 1 tablet by mouth daily. 08/10/17   [provider]  estrogens, conjugated, (  PREMARIN) 1.25 MG tablet Take 1 tablet (1.25 mg total) by mouth daily for 7 days. 07/09/18 07/16/18  [name], MD  levonorgestrel (MIRENA) 20 MCG/24HR IUD 1 each by Intrauterine route once.    [provider]  metroNIDAZOLE (METROGEL) 0.75 % vaginal gel Place 1 Applicatorful vaginally at bedtime. Apply one applicatorful to vagina at bedtime for 10 days, then twice a week for 6 months. 06/27/18   [name], MD  Secnidazole (SOLOSEC) 2 g PACK Take 2 g by mouth once for 1 dose. 07/09/18 07/09/18  [name], MD    Physical Exam Vitals: Blood pressure 128/74, weight 122 lb (55.3 kg), last menstrual period 06/12/2018.  General: NAD HEENT: normocephalic, anicteric Pulmonary: No increased work of breathing Extremities: no edema, erythema, or  tenderness Neurologic: Grossly intact, normal gait Psychiatric: mood appropriate, affect full  US Transvaginal Non-ob  Result Date: 07/09/2018 Patient Name: Krista Daniels DOB: September 08, 1985 MRN: [id number] ULTRASOUND REPORT Location: [facility] Date of Service: 07/09/2018 Indications:AUB Findings: The uterus is anteverted and measures 8.7 x 4.5 x 3.9cm. Echo texture is heterogeneous without evidence of focal masses. The Endometrium measures 3.2 mm. Difficult to evaluate endometrial lining and c/s scar due to IUD. IUD in place Right Ovary measures 3.0 x 1.8 x 2.1cm. It is normal in appearance. Left Ovary measures 2.3 x 2.0 x 2.0cm. It is normal in appearance. Survey of the adnexa demonstrates no adnexal masses. There is no free fluid in the cul de sac. Impression: 1. Slightly heterogeneous uterus, otherwise normal gyn ultrasound. Recommendations: 1.Clinical correlation with the patient's History and Physical Exam. Darlina Guys, RDMS RVT Images reviewed.  IUD in proper orientation and position, with appropriate deployment of both arms.  The endometrium is appropriately thinned around the IUD.  The endometrial myometrial interface is indistinct and is suggestive of a component of adenomyosis [name], MD, [facility], [facility] 07/09/2018, 5:09 PM     Assessment: 33 y.o. [id number] follow up AUB  Plan: Problem List Items Addressed This Visit    None    Visit Diagnoses    Uterus, adenomyosis    -  Primary   IUD check up       Abnormal uterine bleeding          1) AUB-A - suspect a possible component of adenomyosis given the indistinct myometrial endometrial interface.  IUD in proper location.   - Trial 7 day course of premarin 1.25mg  - If failure to respond to premarin course discussed IUD removal with possible switch to contraceptive patch as she has done well on this previously - discussed pitfall of endometrial ablation with 4 year failure rate and patient's  age  23) Recurrent BV  - Refill Silosec as well as saving coupon  3) A total of 15 minutes were spent in face-to-face contact with the patient during this encounter with over half of that time devoted to counseling and coordination of care.  4) Return if symptoms worsen or fail to improve.     [name], MD, [facility], [facility] 07/09/2018, 5:12 PM

## 2019-03-09 ENCOUNTER — Other Ambulatory Visit: Payer: Self-pay | Admitting: Obstetrics and Gynecology

## 2019-03-09 MED ORDER — SOLOSEC 2 G PO PACK: 2.0000 g | PACK | Freq: Once | ORAL | 0 refills | Status: AC

## 2019-03-09 NOTE — Progress Notes (Signed)
Rx RF solosec for BV recurrence

## 2019-04-11 ENCOUNTER — Ambulatory Visit (INDEPENDENT_AMBULATORY_CARE_PROVIDER_SITE_OTHER): Payer: BC Managed Care – PPO | Admitting: Obstetrics and Gynecology

## 2019-04-11 ENCOUNTER — Encounter: Payer: Self-pay | Admitting: Obstetrics and Gynecology

## 2019-04-11 ENCOUNTER — Other Ambulatory Visit: Payer: Self-pay

## 2019-04-11 VITALS — BP 128/76 | HR 77 | Ht 59.0 in | Wt 125.0 lb

## 2019-04-11 DIAGNOSIS — Z23 Encounter for immunization: Secondary | ICD-10-CM | POA: Diagnosis not present

## 2019-04-11 DIAGNOSIS — Z3009 Encounter for other general counseling and advice on contraception: Secondary | ICD-10-CM

## 2019-04-11 DIAGNOSIS — Z30432 Encounter for removal of intrauterine contraceptive device: Secondary | ICD-10-CM | POA: Diagnosis not present

## 2019-04-11 MED ORDER — XULANE 150-35 MCG/24HR TD PTWK: 1.0000 | MEDICATED_PATCH | TRANSDERMAL | 12 refills | Status: DC

## 2019-04-11 NOTE — Progress Notes (Signed)
Obstetrics & Gynecology Office Visit   Chief Complaint:  Chief Complaint  Patient presents with  . Advice Only    Discuss possible surgery    History of Present Illness: 33 y.o. patient presenting for follow up of Mirena IUD placement 5 years ago ago.  The indication for her IUD was contraception.  She admits to any complications since her IUD placement.  She has had some irregular breakthrough bleeding with ultrasound 07/09/2018 showing normal endometrial stripe and IUD in good location. However, she has noted an increased number of episodes of BV since IUD.    Review of Systems: ROS  Past Medical History:  Past Medical History:  Diagnosis Date  . Anxiety   . Anxiety   . BV (bacterial vaginosis)   . Yeast vaginitis     Past Surgical History:  Past Surgical History:  Procedure Laterality Date  . CESAREAN SECTION  2008, 2010    Gynecologic History: No LMP recorded. (Menstrual status: IUD).  Obstetric History: [id number]  Family History:  Family History  Problem Relation Age of Onset  . Breast cancer Maternal Grandmother   . Diabetes Maternal Grandmother   . Healthy Mother   . Heart attack Father   . Colon cancer Maternal Grandfather 29  . Breast cancer Maternal Great-grandmother 21    Social History:  Social History   Socioeconomic History  . Marital status: Married    Spouse name: Not on file  . Number of children: Not on file  . Years of education: Not on file  . Highest education level: Not on file  Occupational History  . Not on file  Social Needs  . Financial resource strain: Not on file  . Food insecurity    Worry: Not on file    Inability: Not on file  . Transportation needs    Medical: Not on file    Non-medical: Not on file  Tobacco Use  . Smoking status: Never Smoker  . Smokeless tobacco: Never Used  Substance and Sexual Activity  . Alcohol use: Yes    Comment: socially  . Drug use: No  . Sexual activity: Yes    Birth  control/protection: I.U.D.  Lifestyle  . Physical activity    Days per week: 5 days    Minutes per session: 50 min  . Stress: Not on file  Relationships  . Social Herbalist on phone: Not on file    Gets together: Not on file    Attends religious service: Not on file    Active member of club or organization: Not on file    Attends meetings of clubs or organizations: Not on file    Relationship status: Not on file  . Intimate partner violence    Fear of current or ex partner: Not on file    Emotionally abused: Not on file    Physically abused: Not on file    Forced sexual activity: Not on file  Other Topics Concern  . Not on file  Social History Narrative  . Not on file    Allergies:  Allergies  Allergen Reactions  . Azithromycin   . Bee Venom   . Hydrocodone     Medications: Prior to Admission medications   Medication Sig Start Date End Date Taking? Authorizing Provider  levonorgestrel (MIRENA) 20 MCG/24HR IUD 1 each by Intrauterine route once.   Yes [provider]  metroNIDAZOLE (METROGEL) 0.75 % vaginal gel Place 1 Applicatorful vaginally at bedtime.  Apply one applicatorful to vagina at bedtime for 10 days, then twice a week for 6 months. 06/27/18  Yes [name], MD  phentermine (ADIPEX-P) 37.5 MG tablet TK 1 T PO QAM PRIOR TO BREAKFAST 04/02/19  Yes [provider]  norelgestromin-ethinyl estradiol Burr Medico) 150-35 MCG/24HR transdermal patch Place 1 patch onto the skin once a week. 04/11/19   [name], MD    Physical Exam Blood pressure 128/76, pulse 77, height 4\' 11"  (1.499 m), weight 125 lb (56.7 kg). No LMP recorded. (Menstrual status: IUD).  General: NAD HEENT: normocephalic, anicteric Pulmonary: No increased work of breathing  Genitourinary:  External: Normal external female genitalia.  Normal urethral meatus, normal  Bartholin's and Skene's glands.    Vagina: Normal vaginal mucosa, no evidence of prolapse.     Cervix: Grossly normal in appearance, no bleeding, IUD strings visualized 2cm  Uterus: Non-enlarged, mobile, normal contour.  No CMT  Adnexa: ovaries non-enlarged, no adnexal masses  Rectal: deferred  Lymphatic: no evidence of inguinal lymphadenopathy Extremities: no edema, erythema, or tenderness Neurologic: Grossly intact Psychiatric: mood appropriate, affect full  Female chaperone present for pelvic and breast  portions of the physical exam    GYNECOLOGY OFFICE PROCEDURE NOTE  Makeba Delcastillo is a 33 y.o. 20 here for UD removal She desires removal secondary to recurrent episodes of BV  IUD Removal  Patient identified, informed consent performed, consent signed.  Patient was in the dorsal lithotomy position, normal external genitalia was noted.  A speculum was placed in the patient's vagina, normal discharge was noted, no lesions. The cervix was visualized, no lesions, no abnormal discharge.  The strings of the IUD were grasped and pulled using ring forceps. The IUD was removed in its entirety.  Patient tolerated the procedure well.    Patient will use  Xulane patch for contraception.  Routine preventative health maintenance measures emphasized.   Assessment: 33 y.o. 20 IUD removal, contraception counseling  Plan: Problem List Items Addressed This Visit    None    Visit Diagnoses    Flu vaccine need    -  Primary   Relevant Orders   Flu Vaccine QUAD 36+ mos IM (Completed)       1.  IUD removed and patient switched to [facility] patch.  Given 2 week of samples.  2.  The patient was interested in discussing ablation.  We discussed that this is a good option for cycle control but has a 4 year failure rate of 27% and therefore is unlikely to represent a long term fix for any menstrual cycle complaints.  Given that her menses were previously normal prior to IUD and on prior ultrasound evaluation a normal uterus was seen I discussed BTL as another option for cycle control  3.   She will return for a annual exam in 1 year.  All questions answered.  4) A total of 15 minutes were spent in face-to-face contact with the patient during this encounter with over half of that time devoted to counseling and coordination of care.  5) Return in about 6 weeks (around 05/23/2019) for phone visit medication follow up.   05/25/2019, MD, [name], [facility]

## 2019-04-22 ENCOUNTER — Telehealth: Payer: Self-pay

## 2019-04-22 NOTE — Telephone Encounter (Signed)
Spoke w/patient. Advised on heavy bleeding protocol and how to take Ibuprofen as described in My chart message. Patient notified normal to have irregular bleeding when switching forms of birth control. Can last up to 3 months. Patient will monitor and notify us if s&s continue/worsen or do not improve.

## 2019-04-22 NOTE — Telephone Encounter (Signed)
Unable to reach patient. VM full. My chart message sent.

## 2019-04-22 NOTE — Telephone Encounter (Signed)
Patient had Mirena removed 04/11/2019 and switched to patch. Since then she has experienced a very heavy period. Today she is doubled over. Patient inquiring whether to continue to monitor and treat w/meds and heating pad. CH#[phone]

## 2019-05-20 ENCOUNTER — Encounter: Payer: Self-pay | Admitting: Obstetrics and Gynecology

## 2019-05-20 ENCOUNTER — Ambulatory Visit (INDEPENDENT_AMBULATORY_CARE_PROVIDER_SITE_OTHER): Payer: BC Managed Care – PPO | Admitting: Obstetrics and Gynecology

## 2019-05-20 ENCOUNTER — Other Ambulatory Visit: Payer: Self-pay

## 2019-05-20 VITALS — Ht 59.0 in | Wt 131.0 lb

## 2019-05-20 DIAGNOSIS — N939 Abnormal uterine and vaginal bleeding, unspecified: Secondary | ICD-10-CM | POA: Diagnosis not present

## 2019-05-20 DIAGNOSIS — B373 Candidiasis of vulva and vagina: Secondary | ICD-10-CM | POA: Diagnosis not present

## 2019-05-20 DIAGNOSIS — B3731 Acute candidiasis of vulva and vagina: Secondary | ICD-10-CM

## 2019-05-20 MED ORDER — TERCONAZOLE 0.8 % VA CREA: 1.0000 | TOPICAL_CREAM | Freq: Every day | VAGINAL | 0 refills | Status: AC

## 2019-05-20 MED ORDER — MEDROXYPROGESTERONE ACETATE 10 MG PO TABS: 10.0000 mg | ORAL_TABLET | Freq: Every day | ORAL | 0 refills | Status: DC

## 2019-05-20 NOTE — Progress Notes (Signed)
Obstetrics & Gynecology Office Visit   Chief Complaint:  Chief Complaint  Patient presents with  . Follow-up    patch/bleeding    History of Present Illness: 33 y.o. [id number] presenting for medication follow up for a diagnosis of AUB.  She is currently being managed with contraceptive patch.   The patient reports improvement in symptoms but continued irregular breakthrough bleeding.  She has not noted any side-effects or new symptoms.  Prior ultrasound revealed structurally normal uterus and endometrium 07/09/2018  Review of Systems: Review of Systems  Constitutional: Negative.   Gastrointestinal: Negative for nausea.  Neurological: Negative for headaches.    Past Medical History:  Past Medical History:  Diagnosis Date  . Anxiety   . Anxiety   . BV (bacterial vaginosis)   . Yeast vaginitis     Past Surgical History:  Past Surgical History:  Procedure Laterality Date  . CESAREAN SECTION  2008, 2010    Gynecologic History: Patient's last menstrual period was 05/07/2019 (exact date).  Obstetric History: [id number]  Family History:  Family History  Problem Relation Age of Onset  . Breast cancer Maternal Grandmother   . Diabetes Maternal Grandmother   . Healthy Mother   . Heart attack Father   . Colon cancer Maternal Grandfather 21  . Breast cancer Maternal Great-grandmother 77    Social History:  Social History   Socioeconomic History  . Marital status: Married    Spouse name: Not on file  . Number of children: Not on file  . Years of education: Not on file  . Highest education level: Not on file  Occupational History  . Not on file  Social Needs  . Financial resource strain: Not on file  . Food insecurity    Worry: Not on file    Inability: Not on file  . Transportation needs    Medical: Not on file    Non-medical: Not on file  Tobacco Use  . Smoking status: Never Smoker  . Smokeless tobacco: Never Used  Substance and Sexual Activity  . Alcohol  use: Yes    Comment: socially  . Drug use: No  . Sexual activity: Yes    Birth control/protection: Patch  Lifestyle  . Physical activity    Days per week: 5 days    Minutes per session: 50 min  . Stress: Not on file  Relationships  . Social Herbalist on phone: Not on file    Gets together: Not on file    Attends religious service: Not on file    Active member of club or organization: Not on file    Attends meetings of clubs or organizations: Not on file    Relationship status: Not on file  . Intimate partner violence    Fear of current or ex partner: Not on file    Emotionally abused: Not on file    Physically abused: Not on file    Forced sexual activity: Not on file  Other Topics Concern  . Not on file  Social History Narrative  . Not on file    Allergies:  Allergies  Allergen Reactions  . Azithromycin   . Bee Venom   . Hydrocodone     Medications: Prior to Admission medications   Medication Sig Start Date End Date Taking? Authorizing Provider  metroNIDAZOLE (METROGEL) 0.75 % vaginal gel Place 1 Applicatorful vaginally at bedtime. Apply one applicatorful to vagina at bedtime for 10 days, then twice a week  for 6 months. 06/27/18  Yes [name], MD  norelgestromin-ethinyl estradiol Burr [facility]ico) 150-35 MCG/24HR transdermal patch Place 1 patch onto the skin once a week. 04/11/19  Yes [name], MD  phentermine (ADIPEX-P) 37.5 MG tablet TK 1 T PO QAM PRIOR TO BREAKFAST 04/02/19  Yes [provider]  medroxyPROGESTERone (PROVERA) 10 MG tablet Take 1 tablet (10 mg total) by mouth daily for 10 days. 05/20/19 05/30/19  [name], MD  terconazole (TERAZOL 3) 0.8 % vaginal cream Place 1 applicator vaginally at bedtime for 3 days. 05/20/19 05/23/19  [name], MD    Physical Exam Vitals: There were no vitals filed for this visit. Patient's last menstrual period was 05/07/2019 (exact date).  General: NAD HEENT: normocephalic,  anicteric Pulmonary: No increased work of breathing Extremities: no edema, erythema, or tenderness Neurologic: Grossly intact Psychiatric: mood appropriate, affect full  Female chaperone present for pelvic  portions of the physical exam  Assessment: 33 y.o. [id number] follow up AUB, also recurrent candida  Plan: Problem List Items Addressed This Visit    None    Visit Diagnoses    Recurrent candidiasis of vagina    -  Primary   Abnormal uterine bleeding         1) Candida  - terazol 7 - Hylafem samples  2) AUB - 10 day course of provera - will do a hormone free month before restarting contraceptive patch with next cycle.  Is comfortable using barrier contraception   3) A total of 15 minutes were spent in face-to-face contact with the patient during this encounter with over half of that time devoted to counseling and coordination of care.  4) Return in about 6 weeks (around 07/01/2019) for Medication follow up.    [name], MD, [facility], [facility]

## 2019-06-04 DIAGNOSIS — M25519 Pain in unspecified shoulder: Secondary | ICD-10-CM | POA: Insufficient documentation

## 2019-06-04 DIAGNOSIS — S4350XA Sprain of unspecified acromioclavicular joint, initial encounter: Secondary | ICD-10-CM | POA: Insufficient documentation

## 2019-06-04 DIAGNOSIS — R609 Edema, unspecified: Secondary | ICD-10-CM | POA: Insufficient documentation

## 2019-06-04 DIAGNOSIS — M7550 Bursitis of unspecified shoulder: Secondary | ICD-10-CM | POA: Insufficient documentation

## 2019-06-04 DIAGNOSIS — R293 Abnormal posture: Secondary | ICD-10-CM | POA: Insufficient documentation

## 2019-06-04 DIAGNOSIS — M6281 Muscle weakness (generalized): Secondary | ICD-10-CM | POA: Insufficient documentation

## 2019-07-01 ENCOUNTER — Ambulatory Visit (INDEPENDENT_AMBULATORY_CARE_PROVIDER_SITE_OTHER): Payer: BC Managed Care – PPO | Admitting: Obstetrics and Gynecology

## 2019-07-01 ENCOUNTER — Other Ambulatory Visit: Payer: Self-pay

## 2019-07-01 ENCOUNTER — Encounter: Payer: Self-pay | Admitting: Obstetrics and Gynecology

## 2019-07-01 VITALS — Ht 59.0 in | Wt 122.5 lb

## 2019-07-01 DIAGNOSIS — N939 Abnormal uterine and vaginal bleeding, unspecified: Secondary | ICD-10-CM

## 2019-07-01 NOTE — Progress Notes (Signed)
I connected with Krista Daniels on 07/01/19 at  9:10 AM EST by telephone and verified that I am speaking with the correct person using two identifiers.   I discussed the limitations, risks, security and privacy concerns of performing an evaluation and management service by telephone and the availability of in person appointments. I also discussed with the patient that there may be a patient responsible charge related to this service. The patient expressed understanding and agreed to proceed.  The patient was at home I spoke with the patient from my workstation phone The names of people involved in this encounter were: Krista Daniels , and Krista Daniels   Obstetrics & Gynecology Office Visit   Chief Complaint:  Chief Complaint  Patient presents with  . Follow-up    Provera working well    History of Present Illness: 33 y.o. [id number] presenting for medication follow up for a diagnosis of AUB on Xulane patch.  We did a course of provera iwth 1 month off any exogenous hormones and she reports a normal menses this month.  Previously good cycle control on Mirena IUD but problems with recurrent BV..    The patient reports good control of symptoms on her current regimen.  She has not noted any side-effects or new symptoms.    Review of Systems: Review of Systems  Constitutional: Negative.   Gastrointestinal: Negative.   Genitourinary: Negative.   Endo/Heme/Allergies: Does not bruise/bleed easily.     Past Medical History:  Past Medical History:  Diagnosis Date  . Anxiety   . Anxiety   . BV (bacterial vaginosis)   . Yeast vaginitis     Past Surgical History:  Past Surgical History:  Procedure Laterality Date  . CESAREAN SECTION  2008, 2010    Gynecologic History: Patient's last menstrual period was 06/24/2019 (exact date).  Obstetric History: [id number]  Family History:  Family History  Problem Relation Age of Onset  . Breast cancer Maternal Grandmother   . Diabetes  Maternal Grandmother   . Healthy Mother   . Heart attack Father   . Colon cancer Maternal Grandfather 71  . Breast cancer Maternal Great-grandmother 80    Social History:  Social History   Socioeconomic History  . Marital status: Married    Spouse name: Not on file  . Number of children: Not on file  . Years of education: Not on file  . Highest education level: Not on file  Occupational History  . Not on file  Tobacco Use  . Smoking status: Never Smoker  . Smokeless tobacco: Never Used  Substance and Sexual Activity  . Alcohol use: Yes    Comment: socially  . Drug use: No  . Sexual activity: Yes    Birth control/protection: Patch  Other Topics Concern  . Not on file  Social History Narrative  . Not on file   Social Determinants of Health   Financial Resource Strain:   . Difficulty of Paying Living Expenses: Not on file  Food Insecurity:   . Worried About Programme researcher, broadcasting/film/video in the Last Year: Not on file  . Ran Out of Food in the Last Year: Not on file  Transportation Needs:   . Lack of Transportation (Medical): Not on file  . Lack of Transportation (Non-Medical): Not on file  Physical Activity:   . Days of Exercise per Week: Not on file  . Minutes of Exercise per Session: Not on file  Stress:   . Feeling of Stress : Not  on file  Social Connections:   . Frequency of Communication with Friends and Family: Not on file  . Frequency of Social Gatherings with Friends and Family: Not on file  . Attends Religious Services: Not on file  . Active Member of Clubs or Organizations: Not on file  . Attends Archivist Meetings: Not on file  . Marital Status: Not on file  Intimate Partner Violence:   . Fear of Current or Ex-Partner: Not on file  . Emotionally Abused: Not on file  . Physically Abused: Not on file  . Sexually Abused: Not on file    Allergies:  Allergies  Allergen Reactions  . Azithromycin   . Bee Venom   . Hydrocodone      Medications: Prior to Admission medications   Medication Sig Start Date End Date Taking? Authorizing Provider  norelgestromin-ethinyl estradiol Marilu Favre) 150-35 MCG/24HR transdermal patch Place 1 patch onto the skin once a week. 04/11/19  Yes Malachy Mood, MD  phentermine (ADIPEX-P) 37.5 MG tablet TK 1 T PO QAM PRIOR TO BREAKFAST 04/02/19  Yes [provider]  venlafaxine XR (EFFEXOR-XR) 37.5 MG 24 hr capsule Take 37.5 mg by mouth daily. 06/04/19  Yes [provider]  medroxyPROGESTERone (PROVERA) 10 MG tablet Take 1 tablet (10 mg total) by mouth daily for 10 days. 05/20/19 05/30/19  Malachy Mood, MD    Physical Exam Vitals: There were no vitals filed for this visit. Patient's last menstrual period was 06/24/2019 (exact date).  No physical exam as this was a remote telephone visit to promote social distancing during the current COVID-19 Pandemic  Assessment: 33 y.o. [id number] follow up AUB  Plan: Problem List Items Addressed This Visit    None    Visit Diagnoses    Abnormal uterine bleeding    -  Primary     1) Restart patch now  2) If continued irregular bleeding standing order flagyl and return for IUD  3) Telephone time 8:32 minutes  4) Return in about 1 year (around 06/30/2020) for annual.    Malachy Mood, MD, [place], [facility] 07/01/2019, 10:02 AM

## 2019-12-30 DIAGNOSIS — R011 Cardiac murmur, unspecified: Secondary | ICD-10-CM | POA: Insufficient documentation

## 2020-01-23 ENCOUNTER — Emergency Department
Admission: EM | Admit: 2020-01-23 | Discharge: 2020-01-23 | Disposition: A | Discharge status: Home / Self Care | Payer: BC Managed Care – PPO | Attending: Emergency Medicine | Admitting: Emergency Medicine

## 2020-01-23 ENCOUNTER — Other Ambulatory Visit: Payer: Self-pay

## 2020-01-23 DIAGNOSIS — Z79899 Other long term (current) drug therapy: Secondary | ICD-10-CM | POA: Insufficient documentation

## 2020-01-23 DIAGNOSIS — T63441A Toxic effect of venom of bees, accidental (unintentional), initial encounter: Secondary | ICD-10-CM | POA: Diagnosis present

## 2020-01-23 MED ORDER — PREDNISONE 20 MG PO TABS
60.0000 mg | ORAL_TABLET | Freq: Once | ORAL | Status: AC
  Administered 2020-01-23: 60 mg via ORAL
  Filled 2020-01-23: qty 3

## 2020-01-23 MED ORDER — FAMOTIDINE 20 MG PO TABS
20.0000 mg | ORAL_TABLET | Freq: Once | ORAL | Status: AC
  Administered 2020-01-23: 20 mg via ORAL
  Filled 2020-01-23: qty 1

## 2020-01-23 MED ORDER — HYDROXYZINE PAMOATE 50 MG PO CAPS: 50.0000 mg | ORAL_CAPSULE | Freq: Three times a day (TID) | ORAL | 0 refills | Status: DC | PRN

## 2020-01-23 MED ORDER — PREDNISONE 50 MG PO TABS: 50.0000 mg | ORAL_TABLET | Freq: Every day | ORAL | 0 refills | Status: DC

## 2020-01-23 MED ORDER — EPINEPHRINE 0.3 MG/0.3ML IJ SOAJ: 0.3000 mg | Freq: Once | INTRAMUSCULAR | 1 refills | Status: AC

## 2020-01-23 MED ORDER — MORPHINE SULFATE (PF) 2 MG/ML IV SOLN
2.0000 mg | Freq: Once | INTRAVENOUS | Status: AC
  Administered 2020-01-23: 2 mg via INTRAMUSCULAR
  Filled 2020-01-23: qty 1

## 2020-01-23 MED ORDER — DIPHENHYDRAMINE HCL 50 MG/ML IJ SOLN
50.0000 mg | Freq: Once | INTRAMUSCULAR | Status: AC
  Administered 2020-01-23: 50 mg via INTRAVENOUS
  Filled 2020-01-23: qty 1

## 2020-01-23 MED ORDER — METHYLPREDNISOLONE SODIUM SUCC 125 MG IJ SOLR
125.0000 mg | Freq: Once | INTRAMUSCULAR | Status: AC
  Administered 2020-01-23: 125 mg via INTRAVENOUS
  Filled 2020-01-23: qty 2

## 2020-01-23 NOTE — ED Provider Notes (Signed)
[facility] Emergency Department Provider Note  ____________________________________________  Time seen: Approximately 6:59 PM  I have reviewed the triage vital signs and the nursing notes.   HISTORY  Chief Complaint Allergic Reaction    HPI Krista Daniels is a 34 y.o. female who presents the emergency department complaining of bee sting to the left side of the face.  This occurred just lateral to her left eyebrow.  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  She has had 2 significant reactions after stings in the past.  1 of which required epinephrine.  Patient patient states that she has no shortness of breath, hives, difficulty breathing or swallowing.  No emesis, nausea, diarrhea.  Patient has not take any medications prior to arrival.  She states that she has been prescribed an EpiPen but it is out of date.         Past Medical History:  Diagnosis Date  . Anxiety   . Anxiety   . BV (bacterial vaginosis)   . Yeast vaginitis     There are no problems to display for this patient.   Past Surgical History:  Procedure Laterality Date  . CESAREAN SECTION  2008, 2010    Prior to Admission medications   Medication Sig Start Date End Date Taking? Authorizing Provider  EPINEPHrine 0.3 mg/0.3 mL IJ SOAJ injection Inject 0.3 mLs (0.3 mg total) into the muscle once for 1 dose. 01/23/20 01/23/20  Abayomi Pattison, Delorise Royals, PA-C  hydrOXYzine (VISTARIL) 50 MG capsule Take 1 capsule (50 mg total) by mouth 3 (three) times daily as needed. 01/23/20   Pamlea Finder, Delorise Royals, PA-C  medroxyPROGESTERone (PROVERA) 10 MG tablet Take 1 tablet (10 mg total) by mouth daily for 10 days. 05/20/19 05/30/19  [name], MD  norelgestromin-ethinyl estradiol [employer]) 150-35 MCG/24HR transdermal patch Place 1 patch onto the skin once a week. 04/11/19   [name], MD  phentermine (ADIPEX-P) 37.5 MG tablet TK 1 T PO QAM PRIOR TO BREAKFAST 04/02/19    [provider]  predniSONE (DELTASONE) 50 MG tablet Take 1 tablet (50 mg total) by mouth daily with breakfast. 01/23/20   Madysyn Hanken, Delorise Royals, PA-C  venlafaxine XR (EFFEXOR-XR) 37.5 MG 24 hr capsule Take 37.5 mg by mouth daily. 06/04/19   [provider]    Allergies Azithromycin, Bee venom, and Hydrocodone  Family History  Problem Relation Age of Onset  . Breast cancer Maternal Grandmother   . Diabetes Maternal Grandmother   . Healthy Mother   . Heart attack Father   . Colon cancer Maternal Grandfather 13  . Breast cancer Maternal Great-grandmother 71    Social History Social History   Tobacco Use  . Smoking status: Never Smoker  . Smokeless tobacco: Never Used  Vaping Use  . Vaping Use: Never used  Substance Use Topics  . Alcohol use: Yes    Comment: socially  . Drug use: No     Review of Systems  Constitutional: No fever/chills Eyes: No visual changes. No discharge ENT: No upper respiratory complaints. Cardiovascular: no chest pain. Respiratory: no cough. No SOB. Gastrointestinal: No abdominal pain.  No nausea, no vomiting.  No diarrhea.  No constipation. Musculoskeletal: Negative for musculoskeletal pain. Skin: Bee sting to the left face Neurological: Negative for headaches, focal weakness or numbness. 10-point ROS otherwise negative.  ____________________________________________   PHYSICAL EXAM:  VITAL SIGNS: ED Triage Vitals  Enc Vitals Group     BP 01/23/20 1836 105/74  Pulse Rate 01/23/20 1836 66     Resp 01/23/20 1836 18     Temp 01/23/20 1836 98.3 F (36.8 C)     Temp Source 01/23/20 1836 Oral     SpO2 01/23/20 1836 98 %     Weight 01/23/20 1837 145 lb (65.8 kg)     Height 01/23/20 1837 4\' 11"  (1.499 m)     Head Circumference --      Peak Flow --      Pain Score 01/23/20 1837 4     Pain Loc --      Pain Edu? --      Excl. in GC? --      Constitutional: Alert and oriented. Well appearing and in no acute  distress. Eyes: Conjunctivae are normal. PERRL. EOMI. Head: Atraumatic.  Visualization of the left face reveals no edema, erythema.  No angioedema. ENT:      Ears:       Nose: No congestion/rhinnorhea.      Mouth/Throat: Mucous membranes are moist.  Neck: No stridor.    Cardiovascular: Normal rate, regular rhythm. Normal S1 and S2.  Good peripheral circulation. Respiratory: Normal respiratory effort without tachypnea or retractions. Lungs CTAB. Good air entry to the bases with no decreased or absent breath sounds. Musculoskeletal: Full range of motion to all extremities. No gross deformities appreciated. Neurologic:  Normal speech and language. No gross focal neurologic deficits are appreciated.  Skin:  Skin is warm, dry and intact. No rash noted.  No hives or wheals. Psychiatric: Mood and affect are normal. Speech and behavior are normal. Patient exhibits appropriate insight and judgement.   ____________________________________________   LABS (all labs ordered are listed, but only abnormal results are displayed)  Labs Reviewed - No data to display ____________________________________________  EKG   ____________________________________________  RADIOLOGY   No results found.  ____________________________________________    PROCEDURES  Procedure(s) performed:    Procedures    Medications  predniSONE (DELTASONE) tablet 60 mg (has no administration in time range)  diphenhydrAMINE (BENADRYL) injection 50 mg (50 mg Intravenous Given 01/23/20 1941)  methylPREDNISolone sodium succinate (SOLU-MEDROL) 125 mg/2 mL injection 125 mg (125 mg Intravenous Given 01/23/20 1941)  famotidine (PEPCID) tablet 20 mg (20 mg Oral Given 01/23/20 1942)  morphine 2 MG/ML injection 2 mg (2 mg Intramuscular Given 01/23/20 1942)     ____________________________________________   INITIAL IMPRESSION / ASSESSMENT AND PLAN / ED COURSE  Pertinent labs & imaging results that were available during  my care of the patient were reviewed by me and considered in my medical decision making (see chart for details).  Review of the [place] CSRS was performed in accordance of the [facility] prior to dispensing any controlled drugs.           Patient's diagnosis is consistent with bee sting.  Patient presented to emergency department with a concern over a bee sting.  Patient has a history of allergic reactions and has received epi in the past for bee stings.  She states that her current prescription for epinephrine has expired and she is not taking medication.  She was stung to the left side of the face but denies any shortness of breath, hives, wheals, GI complaints.  Overall exam is reassuring with no acute findings.  Patient was given IV diphenhydramine, Solu-Medrol.  Dose of oral Pepcid was also administered.  Patient was evaluated in the emergency department for 3-1/2 hours with no change in symptoms and patient was deemed stable for discharge.  Prior to discharge I did give the patient an additional dose of oral prednisone.. Patient will be discharged home with prescriptions for Vistaril, prednisone, EpiPen. Patient is to follow up with primary care as needed or otherwise directed. Patient is given ED precautions to return to the ED for any worsening or new symptoms.     ____________________________________________  FINAL CLINICAL IMPRESSION(S) / ED DIAGNOSES  Final diagnoses:  Bee sting, accidental or unintentional, initial encounter      NEW MEDICATIONS STARTED DURING THIS VISIT:  ED Discharge Orders         Ordered    predniSONE (DELTASONE) 50 MG tablet  Daily with breakfast     Discontinue  Reprint     01/23/20 2141    hydrOXYzine (VISTARIL) 50 MG capsule  3 times daily PRN     Discontinue  Reprint     01/23/20 2141    EPINEPHrine 0.3 mg/0.3 mL IJ SOAJ injection   Once     Discontinue  Reprint     01/23/20 2141              This chart was dictated using voice recognition  software/Dragon. Despite best efforts to proofread, errors can occur which can change the meaning. Any change was purely unintentional.    Lanette Hampshire 01/23/20 2143    Sharman Cheek, MD 01/24/20 [phone]

## 2020-01-23 NOTE — ED Notes (Signed)
Pt with bee sting to left forehead. No airway swelling.

## 2020-01-23 NOTE — ED Triage Notes (Signed)
Pt to the er for an allergic reaction to a bee sting to the left side forehead. Pt has a known bee allergy and has used an epi pen in the past. Pt does not currently have an epi pen. Pt reports she is shaky and has some chest tightness but thinks it may be anxiety. No airway compromise.

## 2020-03-04 ENCOUNTER — Ambulatory Visit (INDEPENDENT_AMBULATORY_CARE_PROVIDER_SITE_OTHER): Payer: BC Managed Care – PPO | Admitting: Obstetrics and Gynecology

## 2020-03-04 ENCOUNTER — Other Ambulatory Visit: Payer: Self-pay

## 2020-03-04 ENCOUNTER — Encounter: Payer: Self-pay | Admitting: Obstetrics and Gynecology

## 2020-03-04 VITALS — BP 136/75 | HR 73 | Ht 59.0 in | Wt 142.0 lb

## 2020-03-04 DIAGNOSIS — Z1329 Encounter for screening for other suspected endocrine disorder: Secondary | ICD-10-CM

## 2020-03-04 DIAGNOSIS — Z6826 Body mass index (BMI) 26.0-26.9, adult: Secondary | ICD-10-CM

## 2020-03-04 DIAGNOSIS — Z3045 Encounter for surveillance of transdermal patch hormonal contraceptive device: Secondary | ICD-10-CM

## 2020-03-04 DIAGNOSIS — Z131 Encounter for screening for diabetes mellitus: Secondary | ICD-10-CM

## 2020-03-04 DIAGNOSIS — Z01419 Encounter for gynecological examination (general) (routine) without abnormal findings: Secondary | ICD-10-CM | POA: Diagnosis not present

## 2020-03-04 DIAGNOSIS — Z13220 Encounter for screening for lipoid disorders: Secondary | ICD-10-CM

## 2020-03-04 DIAGNOSIS — E663 Overweight: Secondary | ICD-10-CM

## 2020-03-04 DIAGNOSIS — Z1239 Encounter for other screening for malignant neoplasm of breast: Secondary | ICD-10-CM

## 2020-03-04 NOTE — Progress Notes (Signed)
Gynecology Annual Exam   PCP: Patient, No Pcp Per  Chief Complaint:  Chief Complaint  Patient presents with  . Gynecologic Exam    History of Present Illness: Patient is a 34 y.o. [id number] presents for annual exam. The patient has no complaints today.   LMP: Patient's last menstrual period was 02/23/2020. Average Interval: regular, 28 days Duration of flow: 5 days Heavy Menses: no Clots: no Intermenstrual Bleeding: no Postcoital Bleeding: no Dysmenorrhea: no  The patient is sexually active. She currently uses rhythm method for contraception. She denies dyspareunia.  The patient does perform self breast exams.  There is no notable family history of breast or ovarian cancer in her family.  The patient wears seatbelts: yes.   The patient has regular exercise: not asked.    The patient denies current symptoms of depression.    Review of Systems: Review of Systems  Constitutional: Negative for chills and fever.  HENT: Negative for congestion.   Respiratory: Negative for cough and shortness of breath.   Cardiovascular: Negative for chest pain and palpitations.  Gastrointestinal: Negative for abdominal pain, constipation, diarrhea, heartburn, nausea and vomiting.  Genitourinary: Negative for dysuria, frequency and urgency.  Skin: Negative for itching and rash.  Neurological: Negative for dizziness and headaches.  Endo/Heme/Allergies: Negative for polydipsia.  Psychiatric/Behavioral: Negative for depression.    Past Medical History:  There are no problems to display for this patient.   Past Surgical History:  Past Surgical History:  Procedure Laterality Date  . CESAREAN SECTION  2008, 2010    Gynecologic History:  Patient's last menstrual period was 02/23/2020. Contraception: Mirena IUD 05/13/2014 removed 04/11/2019 and [place] to [place]  Last Pap: Results were:06/27/2018 NIL and HR HPV negative   Obstetric History: [id number]  Family History:  Family History    Problem Relation Age of Onset  . Breast cancer Maternal Grandmother   . Diabetes Maternal Grandmother   . Healthy Mother   . Heart attack Father   . Colon cancer Maternal Grandfather 32  . Breast cancer Maternal Great-grandmother 89    Social History:  Social History   Socioeconomic History  . Marital status: Married    Spouse name: Not on file  . Number of children: Not on file  . Years of education: Not on file  . Highest education level: Not on file  Occupational History  . Not on file  Tobacco Use  . Smoking status: Never Smoker  . Smokeless tobacco: Never Used  Vaping Use  . Vaping Use: Never used  Substance and Sexual Activity  . Alcohol use: Yes    Comment: socially  . Drug use: No  . Sexual activity: Yes    Birth control/protection: None  Other Topics Concern  . Not on file  Social History Narrative  . Not on file   Social Determinants of Health   Financial Resource Strain:   . Difficulty of Paying Living Expenses: Not on file  Food Insecurity:   . Worried About Programme researcher, broadcasting/film/video in the Last Year: Not on file  . Ran Out of Food in the Last Year: Not on file  Transportation Needs:   . Lack of Transportation (Medical): Not on file  . Lack of Transportation (Non-Medical): Not on file  Physical Activity:   . Days of Exercise per Week: Not on file  . Minutes of Exercise per Session: Not on file  Stress:   . Feeling of Stress : Not on file  Social Connections:   .  Frequency of Communication with Friends and Family: Not on file  . Frequency of Social Gatherings with Friends and Family: Not on file  . Attends Religious Services: Not on file  . Active Member of Clubs or Organizations: Not on file  . Attends Banker Meetings: Not on file  . Marital Status: Not on file  Intimate Partner Violence:   . Fear of Current or Ex-Partner: Not on file  . Emotionally Abused: Not on file  . Physically Abused: Not on file  . Sexually Abused: Not on file     Allergies:  Allergies  Allergen Reactions  . Azithromycin   . Bee Venom   . Hydrocodone     Medications: Prior to Admission medications   Medication Sig Start Date End Date Taking? Authorizing Provider  hydrOXYzine (VISTARIL) 50 MG capsule Take 1 capsule (50 mg total) by mouth 3 (three) times daily as needed. 01/23/20   Cuthriell, Delorise Royals, PA-C  medroxyPROGESTERone (PROVERA) 10 MG tablet Take 1 tablet (10 mg total) by mouth daily for 10 days. 05/20/19 05/30/19  [name], MD  norelgestromin-ethinyl estradiol [employer]) 150-35 MCG/24HR transdermal patch Place 1 patch onto the skin once a week. 04/11/19   [name], MD  phentermine (ADIPEX-P) 37.5 MG tablet TK 1 T PO QAM PRIOR TO BREAKFAST 04/02/19   [provider]  predniSONE (DELTASONE) 50 MG tablet Take 1 tablet (50 mg total) by mouth daily with breakfast. 01/23/20   Cuthriell, Delorise Royals, PA-C  venlafaxine XR (EFFEXOR-XR) 37.5 MG 24 hr capsule Take 37.5 mg by mouth daily. 06/04/19   [provider]    Physical Exam Vitals: Blood pressure 136/75, pulse 73, height 4\' 11"  (1.499 m), weight 142 lb (64.4 kg), last menstrual period 02/23/2020. Body mass index is 28.68 kg/m.   General: NAD HEENT: normocephalic, anicteric Thyroid: no enlargement, no palpable nodules Pulmonary: No increased work of breathing, CTAB Cardiovascular: RRR, distal pulses 2+ Breast: Breast symmetrical, no tenderness, no palpable nodules or masses, no skin or nipple retraction present, no nipple discharge.  No axillary or supraclavicular lymphadenopathy. Abdomen: NABS, soft, non-tender, non-distended.  Umbilicus without lesions.  No hepatomegaly, splenomegaly or masses palpable. No evidence of hernia  Genitourinary:  External: Normal external female genitalia.  Normal urethral meatus, normal Bartholin's and Skene's glands.    Vagina: Normal vaginal mucosa, no evidence of prolapse.    Cervix: Grossly normal in appearance, no  bleeding  Uterus: Non-enlarged, mobile, normal contour.  No CMT  Adnexa: ovaries non-enlarged, no adnexal masses  Rectal: deferred  Lymphatic: no evidence of inguinal lymphadenopathy Extremities: no edema, erythema, or tenderness Neurologic: Grossly intact Psychiatric: mood appropriate, affect full  Female chaperone present for pelvic and breast  portions of the physical exam    Assessment: 34 y.o. 31 routine annual exam  Plan: Problem List Items Addressed This Visit    None    Visit Diagnoses    Encounter for gynecological examination without abnormal finding    -  Primary   Breast screening       Encounter for surveillance of transdermal patch hormonal contraceptive device       Thyroid disorder screening       Relevant Orders   CMP14+LP+TP+TSH+CBC/Plt   Lipid screening       Relevant Orders   CMP14+LP+TP+TSH+CBC/Plt   Diabetes mellitus screening       Relevant Orders   CMP14+LP+TP+TSH+CBC/Plt   Hemoglobin A1c   Overweight (BMI 25.0-29.9)       Relevant Orders  CMP14+LP+TP+TSH+CBC/Plt   Hemoglobin A1c   BMI 26.0-26.9,adult       Relevant Orders   CMP14+LP+TP+TSH+CBC/Plt   Hemoglobin A1c      2) STI screening  was notoffered and therefore not obtained  2)  ASCCP guidelines and rational discussed.  Patient opts for every 3 years screening interval  3) Contraception - the patient is currently using  rhythm method.  She is happy with her current form of contraception and plans to continue - self discontinued patch doing natural family planning  4) Routine healthcare maintenance including cholesterol, diabetes screening discussed Ordered today  5) Overweight - currently under FDA cut off for phentermine course at BMI 28 but if weight related co-morbidity identified on screening would consider cut off of BMI 27  6) No follow-ups on file.   [name], MD, [facility], [facility] 03/04/2020, 3:23 PM

## 2020-03-05 LAB — CMP14+LP+TP+TSH+CBC/PLT
ALT: 6 IU/L (ref 0–32)
AST: 11 IU/L (ref 0–40)
Albumin/Globulin Ratio: 1.8 (ref 1.2–2.2)
Albumin: 4.4 g/dL (ref 3.8–4.8)
Alkaline Phosphatase: 89 IU/L (ref 48–121)
BUN/Creatinine Ratio: 12 (ref 9–23)
BUN: 10 mg/dL (ref 6–20)
Bilirubin Total: 0.2 mg/dL (ref 0.0–1.2)
CO2: 27 mmol/L (ref 20–29)
Calcium: 9.7 mg/dL (ref 8.7–10.2)
Chloride: 102 mmol/L (ref 96–106)
Cholesterol, Total: 236 mg/dL — ABNORMAL HIGH (ref 100–199)
Creatinine, Ser: 0.81 mg/dL (ref 0.57–1.00)
Free Thyroxine Index: 1.3 (ref 1.2–4.9)
GFR calc Af Amer: 109 mL/min/{1.73_m2} (ref >59)
GFR calc non Af Amer: 94 mL/min/{1.73_m2} (ref >59)
Globulin, Total: 2.4 g/dL (ref 1.5–4.5)
Glucose: 81 mg/dL (ref 65–99)
HDL: 60 mg/dL (ref >39)
Hematocrit: 40.1 % (ref 34.0–46.6)
Hemoglobin: 13 g/dL (ref 11.1–15.9)
LDL Chol Calc (NIH): 133 mg/dL — ABNORMAL HIGH (ref 0–99)
LDL/HDL Ratio: 2.2 ratio (ref 0.0–3.2)
MCH: 29.1 pg (ref 26.6–33.0)
MCHC: 32.4 g/dL (ref 31.5–35.7)
MCV: 90 fL (ref 79–97)
Platelets: 318 10*3/uL (ref 150–450)
Potassium: 4.7 mmol/L (ref 3.5–5.2)
RBC: 4.47 x10E6/uL (ref 3.77–5.28)
RDW: 12.7 % (ref 11.7–15.4)
Sodium: 140 mmol/L (ref 134–144)
T3 Uptake Ratio: 22 % — ABNORMAL LOW (ref 24–39)
T4, Total: 6 ug/dL (ref 4.5–12.0)
TSH: 0.796 u[IU]/mL (ref 0.450–4.500)
Total Protein: 6.8 g/dL (ref 6.0–8.5)
Triglycerides: 242 mg/dL — ABNORMAL HIGH (ref 0–149)
VLDL Cholesterol Cal: 43 mg/dL — ABNORMAL HIGH (ref 5–40)
WBC: 5.9 10*3/uL (ref 3.4–10.8)

## 2020-03-05 LAB — HEMOGLOBIN A1C
Est. average glucose Bld gHb Est-mCnc: 117 mg/dL
Hgb A1c MFr Bld: 5.7 % — ABNORMAL HIGH (ref 4.8–5.6)

## 2020-03-19 ENCOUNTER — Other Ambulatory Visit: Payer: Self-pay | Admitting: Obstetrics and Gynecology

## 2020-03-19 MED ORDER — PHENTERMINE HCL 37.5 MG PO TABS: 37.5000 mg | ORAL_TABLET | Freq: Every day | ORAL | 0 refills | Status: DC

## 2020-04-16 ENCOUNTER — Encounter: Payer: Self-pay | Admitting: Obstetrics and Gynecology

## 2020-04-16 ENCOUNTER — Ambulatory Visit (INDEPENDENT_AMBULATORY_CARE_PROVIDER_SITE_OTHER): Payer: BC Managed Care – PPO | Admitting: Obstetrics and Gynecology

## 2020-04-16 ENCOUNTER — Other Ambulatory Visit: Payer: Self-pay

## 2020-04-16 VITALS — Ht 59.0 in | Wt 144.0 lb

## 2020-04-16 DIAGNOSIS — E663 Overweight: Secondary | ICD-10-CM | POA: Diagnosis not present

## 2020-04-16 DIAGNOSIS — Z6829 Body mass index (BMI) 29.0-29.9, adult: Secondary | ICD-10-CM

## 2020-04-16 DIAGNOSIS — F32A Depression, unspecified: Secondary | ICD-10-CM

## 2020-04-16 DIAGNOSIS — F419 Anxiety disorder, unspecified: Secondary | ICD-10-CM | POA: Diagnosis not present

## 2020-04-16 DIAGNOSIS — M797 Fibromyalgia: Secondary | ICD-10-CM | POA: Insufficient documentation

## 2020-04-16 DIAGNOSIS — L709 Acne, unspecified: Secondary | ICD-10-CM | POA: Insufficient documentation

## 2020-04-16 MED ORDER — VENLAFAXINE HCL ER 75 MG PO CP24: 75.5000 mg | ORAL_CAPSULE | Freq: Every day | ORAL | 6 refills | Status: DC

## 2020-04-16 MED ORDER — DIETHYLPROPION HCL ER 75 MG PO TB24: 1.0000 | ORAL_TABLET | Freq: Every day | ORAL | 0 refills | Status: DC

## 2020-04-16 NOTE — Progress Notes (Signed)
I connected with Krista Daniels on 04/17/20 at  3:10 PM EDT by telephone and verified that I am speaking with the correct person using two identifiers.   I discussed the limitations, risks, security and privacy concerns of performing an evaluation and management service by telephone and the availability of in person appointments. I also discussed with the patient that there may be a patient responsible charge related to this service. The patient expressed understanding and agreed to proceed.  The patient was at home. I spoke with the patient from my workstation phone The names of people involved in this encounter were: Krista Daniels , and [name].   Gynecology Office Visit  Chief Complaint:  Chief Complaint  Patient presents with  . Gynecologic Exam    History of Present Illness: Patientis a 34 y.o. [id number] female, who presents for the evaluation of the desire to lose weight. She has gained 2 lbs. The patient states the following symptoms since starting her weight loss therapy: appetite suppression but less than previously.  No weight loss this month and decreased energy. The patient also reports no other ill effects. The patient specifically denies heart palpitations, anxiety, and insomnia.    Review of Systems: 10 point review of systems negative unless otherwise noted in HPI  Past Medical History:  Past Medical History:  Diagnosis Date  . Anxiety   . Anxiety   . BV (bacterial vaginosis)   . Yeast vaginitis     Past Surgical History:  Past Surgical History:  Procedure Laterality Date  . CESAREAN SECTION  2008, 2010    Gynecologic History: Patient's last menstrual period was 04/03/2020.  Obstetric History: [id number]  Family History:  Family History  Problem Relation Age of Onset  . Breast cancer Maternal Grandmother   . Diabetes Maternal Grandmother   . Healthy Mother   . Heart attack Father   . Colon cancer Maternal Grandfather 40  . Breast cancer  Maternal Great-grandmother 29    Social History:  Social History   Socioeconomic History  . Marital status: Married    Spouse name: Not on file  . Number of children: Not on file  . Years of education: Not on file  . Highest education level: Not on file  Occupational History  . Not on file  Tobacco Use  . Smoking status: Never Smoker  . Smokeless tobacco: Never Used  Vaping Use  . Vaping Use: Never used  Substance and Sexual Activity  . Alcohol use: Yes    Comment: socially  . Drug use: No  . Sexual activity: Yes    Birth control/protection: None  Other Topics Concern  . Not on file  Social History Narrative  . Not on file   Social Determinants of Health   Financial Resource Strain:   . Difficulty of Paying Living Expenses: Not on file  Food Insecurity:   . Worried About Programme researcher, broadcasting/film/video in the Last Year: Not on file  . Ran Out of Food in the Last Year: Not on file  Transportation Needs:   . Lack of Transportation (Medical): Not on file  . Lack of Transportation (Non-Medical): Not on file  Physical Activity:   . Days of Exercise per Week: Not on file  . Minutes of Exercise per Session: Not on file  Stress:   . Feeling of Stress : Not on file  Social Connections:   . Frequency of Communication with Friends and Family: Not on file  . Frequency of  Social Gatherings with Friends and Family: Not on file  . Attends Religious Services: Not on file  . Active Member of Clubs or Organizations: Not on file  . Attends Banker Meetings: Not on file  . Marital Status: Not on file  Intimate Partner Violence:   . Fear of Current or Ex-Partner: Not on file  . Emotionally Abused: Not on file  . Physically Abused: Not on file  . Sexually Abused: Not on file    Allergies:  Allergies  Allergen Reactions  . Azithromycin   . Bee Venom   . Hydrocodone     Medications: Prior to Admission medications   Medication Sig Start Date End Date Taking? Authorizing  Provider  venlafaxine XR (EFFEXOR-XR) 37.5 MG 24 hr capsule Take 37.5 mg by mouth daily. 06/04/19  Yes [provider]  zolpidem (AMBIEN) 10 MG tablet Take 10 mg by mouth at bedtime as needed. 02/19/20  Yes [provider]  phentermine (ADIPEX-P) 37.5 MG tablet Take 1 tablet (37.5 mg total) by mouth daily before breakfast. 03/19/20   [name], MD    Physical Exam Height 4\' 11"  (1.499 m), weight 144 lb (65.3 kg), last menstrual period 04/03/2020. Wt Readings from Last 3 Encounters:  04/16/20 144 lb (65.3 kg)  03/04/20 142 lb (64.4 kg)  01/23/20 145 lb (65.8 kg)  Body mass index is 29.08 kg/m.  No physical exam as this was a remote telephone visit to promote social distancing during the current COVID-19 Pandemic   Assessment: 34 y.o. 31 follow up medical weight loss  Plan: Problem List Items Addressed This Visit    None    Visit Diagnoses    Overweight (BMI 25.0-29.9)    -  Primary   BMI 29.0-29.9,adult       Anxiety and depression       Relevant Medications   venlafaxine XR (EFFEXOR-XR) 75 MG 24 hr capsule      1) 1500 Calorie ADA Diet  2) Patient education given regarding appropriate lifestyle changes for weight loss including: regular physical activity, healthy coping strategies, caloric restriction and healthy eating patterns.  3) Patient will be started on weight loss medication. The risks and benefits and side effects of medication, such as Adipex (Phenteramine) ,  Tenuate (Diethylproprion), Contrave (buproprion/naltrexone), Qsymia (phentermine/topiramate), and Saxenda (liraglutide) is discussed. The pros and cons of suppressing appetite and boosting metabolism is discussed. Risks of tolerence and addiction is discussed for selected agents discussed. Use of medicine will ne short term, such as 3-4 months at a time followed by a period of time off of the medicine to avoid these risks and side effects for Adipex, Qsymia, and Tenuate discussed. Pt to  call with any negative side effects and agrees to keep follow up appts. - switch to diethlyproprion   4) Patient to take medication, with the benefits of appetite suppression and metabolism boost d/w pt, along with the side effects and risk factors of long term use that will be avoided with our use of short bursts of therapy. Rx provided.    5) Anxiety - increase Effexor XR to 75mg   6) Telephone Time 8:52   7)  Return in about 4 weeks (around 05/14/2020) for medication follow up.    , MD, 13/06/2020 OB/GYN, [facility] 04/16/2020, 4:17 PM

## 2020-04-16 NOTE — Progress Notes (Signed)
Telephone visit weight management. Pt states she has no energy at all.

## 2020-05-10 DIAGNOSIS — F0781 Postconcussional syndrome: Secondary | ICD-10-CM | POA: Insufficient documentation

## 2020-05-11 ENCOUNTER — Other Ambulatory Visit: Payer: Self-pay

## 2020-05-11 ENCOUNTER — Emergency Department
Admission: EM | Admit: 2020-05-11 | Discharge: 2020-05-11 | Disposition: A | Discharge status: Home / Self Care | Payer: BC Managed Care – PPO | Attending: Emergency Medicine | Admitting: Emergency Medicine

## 2020-05-11 ENCOUNTER — Emergency Department: Payer: BC Managed Care – PPO

## 2020-05-11 ENCOUNTER — Encounter: Payer: Self-pay | Admitting: Emergency Medicine

## 2020-05-11 DIAGNOSIS — S0990XA Unspecified injury of head, initial encounter: Secondary | ICD-10-CM | POA: Diagnosis present

## 2020-05-11 DIAGNOSIS — W228XXA Striking against or struck by other objects, initial encounter: Secondary | ICD-10-CM | POA: Diagnosis not present

## 2020-05-11 DIAGNOSIS — H53149 Visual discomfort, unspecified: Secondary | ICD-10-CM | POA: Diagnosis not present

## 2020-05-11 DIAGNOSIS — R110 Nausea: Secondary | ICD-10-CM | POA: Insufficient documentation

## 2020-05-11 DIAGNOSIS — R202 Paresthesia of skin: Secondary | ICD-10-CM | POA: Diagnosis not present

## 2020-05-11 DIAGNOSIS — S060X0A Concussion without loss of consciousness, initial encounter: Secondary | ICD-10-CM | POA: Diagnosis not present

## 2020-05-11 DIAGNOSIS — Y93E5 Activity, floor mopping and cleaning: Secondary | ICD-10-CM | POA: Insufficient documentation

## 2020-05-11 DIAGNOSIS — Y92002 Bathroom of unspecified non-institutional (private) residence single-family (private) house as the place of occurrence of the external cause: Secondary | ICD-10-CM | POA: Diagnosis not present

## 2020-05-11 NOTE — ED Triage Notes (Signed)
Patient ambulatory to triage with steady gait, without difficulty or distress noted; pt reports on Sunday that she was on her hands and knees cleaning between the tub and toilet and "banged her head on the tub"; no LOC; st went to PCP on Monday and was dx with concussion; c/o persistent pain to left side of face and tingling to arms/fingers with numbness to rt great toe

## 2020-05-11 NOTE — ED Provider Notes (Signed)
[facility] Emergency Department Provider Note  ____________________________________________   First MD Initiated Contact with Patient 05/11/20 [phone]     (approximate)  I have reviewed the triage vital signs and the nursing notes.   HISTORY  Chief Complaint Head Injury    HPI Krista Daniels is a 34 y.o. female who presents for evaluation of symptoms after a head injury 1 to 2 days ago.  She reports that she was cleaning her bathroom and accidentally smashed her forehead onto the bathtub.  She did not lose consciousness but has had persistent nausea, headache, and photophobia since then.  She went to her doctor yesterday and was diagnosed clinically with a concussion.  She received no imaging.  She has had no neck pain and no weakness in her extremities but last night she was having some trouble with short-term memory which was concerning to her and then she felt some tingling and numbness in her right big toe as well as subsequently some tingling and numbness to the left side of her face and her left hand and fingers.  That numbness has resolved but the numbness is still present in her right toe and she was concerned enough that she felt she should be evaluated.  She has a persistent nausea but no vomiting.  No visual changes other than the sensitivity to light.  She remembers everything that happened with no amnesia to the event.  She has no neck pain and no weakness in her arms or legs.  She denies sore throat, chest pain, shortness of breath, and abdominal pain.  No prior history of head injuries and no blood thinners.  Acute onset, symptoms are moderate to severe.  Nothing in particular makes them better.         Past Medical History:  Diagnosis Date  . Anxiety   . Anxiety   . BV (bacterial vaginosis)   . Yeast vaginitis     Patient Active Problem List   Diagnosis Date Noted  . Acne 04/16/2020  . Fibromyalgia 04/16/2020  . Cardiac murmur 12/30/2019    . Abnormal posture 06/04/2019  . Bursitis of shoulder 06/04/2019  . Edema 06/04/2019  . Muscle weakness 06/04/2019  . Shoulder joint pain 06/04/2019  . Sprain of acromioclavicular ligament 06/04/2019  . Anxiety state 08/10/2017  . Other insomnia 08/10/2017  . Left foot pain 11/04/2015  . Left arm pain 03/21/2013  . 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 CESAREAN SECTION  2008, 2010    Prior to Admission medications   Medication Sig Start Date End Date Taking? Authorizing Provider  Diethylpropion HCl CR 75 MG TB24 Take 1 tablet (75 mg total) by mouth daily before breakfast. 04/16/20   [name], MD  venlafaxine XR (EFFEXOR-XR) 75 MG 24 hr capsule Take 1 capsule (75.5 mg total) by mouth daily. 04/16/20   [name], MD  zolpidem (AMBIEN) 10 MG tablet Take 10 mg by mouth at bedtime as needed. 02/19/20   [provider]    Allergies Azithromycin, Bee venom, and Hydrocodone  Family History  Problem Relation Age of Onset  . Breast cancer Maternal Grandmother   . Diabetes Maternal Grandmother   . Healthy Mother   . Heart attack Father   . Colon cancer Maternal Grandfather 72  . Breast cancer Maternal Great-grandmother 32    Social History Social History   Tobacco Use  . Smoking status: Never Smoker  . Smokeless tobacco:  Never Used  Vaping Use  . Vaping Use: Never used  Substance Use Topics  . Alcohol use: Yes    Comment: socially  . Drug use: No    Review of Systems Constitutional: No fever/chills Eyes: Photophobia but no visual changes. ENT: No sore throat. Cardiovascular: Denies chest pain. Respiratory: Denies shortness of breath. Gastrointestinal: No abdominal pain.  No nausea, no vomiting.  No diarrhea.  No constipation. Genitourinary: Negative for dysuria. Musculoskeletal: Negative for neck pain.  Negative for back pain. Integumentary: Negative for rash. Neurological: Mild headache.   Some short-term memory issues.  Numbness in right great toe and intermittently in the left hand and fingers.   ____________________________________________   PHYSICAL EXAM:  VITAL SIGNS: ED Triage Vitals  Enc Vitals Group     BP 05/11/20 0312 134/82     Pulse Rate 05/11/20 0312 67     Resp 05/11/20 0312 16     Temp 05/11/20 0312 99 F (37.2 C)     Temp Source 05/11/20 0312 Oral     SpO2 05/11/20 0312 100 %     Weight 05/11/20 0258 65.8 kg (145 lb)     Height 05/11/20 0258 1.499 m (4\' 11" )     Head Circumference --      Peak Flow --      Pain Score 05/11/20 0258 3     Pain Loc --      Pain Edu? --      Excl. in GC? --     Constitutional: Alert and oriented.  Eyes: Conjunctivae are normal.  Pupils are equal and reactive, extraocular motion is intact, no nystagmus. Head: Atraumatic.  I do not see evidence of hematoma or bruising to the patient's forehead. Nose: No congestion/rhinnorhea. Mouth/Throat: Patient is wearing a mask. Neck: No stridor.  No meningeal signs.   Cardiovascular: Normal rate, regular rhythm. Good peripheral circulation. Grossly normal heart sounds. Respiratory: Normal respiratory effort.  No retractions. Gastrointestinal: Soft and nontender. No distention.  Musculoskeletal: No lower extremity tenderness nor edema. No gross deformities of extremities. Neurologic:  Normal speech and language. No gross focal neurologic deficits are appreciated.  Skin:  Skin is warm, dry and intact. Psychiatric: Mood and affect are normal. Speech and behavior are normal.  ____________________________________________   LABS (all labs ordered are listed, but only abnormal results are displayed)  Labs Reviewed - No data to display ____________________________________________  EKG  No indication for emergent EKG ____________________________________________  RADIOLOGY I, 13/09/21, personally viewed and evaluated these images (plain radiographs) as part of my medical  decision making, as well as reviewing the written report by the radiologist.  ED MD interpretation: No acute abnormalities identified on head CT  Official radiology report(s): CT Head Wo Contrast  Result Date: 05/11/2020 CLINICAL DATA:  Head trauma a few days ago. Persistent headache. EXAM: CT HEAD WITHOUT CONTRAST TECHNIQUE: Contiguous axial images were obtained from the base of the skull through the vertex without intravenous contrast. COMPARISON:  Head CT 02/11/2013 FINDINGS: Brain: The ventricles are normal in size and configuration. No extra-axial fluid collections are identified. The gray-white differentiation is maintained. No CT findings for acute hemispheric infarction or intracranial hemorrhage. No mass lesions. The brainstem and cerebellum are normal. Vascular: No hyperdense vessels or obvious aneurysm. Skull: No acute skull fracture. No bone lesion. Sinuses/Orbits: The paranasal sinuses and mastoid air cells are clear. The globes are intact. Other: No scalp lesions, laceration or hematoma. IMPRESSION: Normal head CT. Electronically Signed   By: 04/13/2013.  Gallerani M.D.   On: 05/11/2020 05:27    ____________________________________________   PROCEDURES   Procedure(s) performed (including Critical Care):  Procedures   ____________________________________________   INITIAL IMPRESSION / MDM / ASSESSMENT AND PLAN / ED COURSE  As part of my medical decision making, I reviewed the following data within the electronic MEDICAL RECORD NUMBER Nursing notes reviewed and incorporated, Old chart reviewed and Notes from prior ED visits   Differential diagnosis includes, but is not limited to, postconcussive syndrome/concussion, acute intracranial bleeding, subdural hematoma, less likely epidural hemorrhage.  Patient's vital signs are stable.  Symptoms are most consistent with concussion.  No focal neurological deficits.  I discussed with the patient the benefit (or lack thereof) of imaging, but she  would like a CT scan of the head and I think it is reasonable given the description of her symptoms.  I explained that I do not have a particular good explanation for why she is having numbness primarily in her right toe but I suspect is not indicative of an acute or emergent medical condition.  We obtained a CT scan without IV contrast and there is no acute abnormalities which I confirmed by personally reviewing the images as well as the radiology report.  The patient is comfortable with the plan for discharge and outpatient follow-up and I gave her my usual customary head injury follow-up recommendations and return precautions.           ____________________________________________  FINAL CLINICAL IMPRESSION(S) / ED DIAGNOSES  Final diagnoses:  Concussion without loss of consciousness, initial encounter     MEDICATIONS GIVEN DURING THIS VISIT:  Medications - No data to display   ED Discharge Orders    None      *Please note:  Andrienne Havener was evaluated in Emergency Department on 05/11/2020 for the symptoms described in the history of present illness. She was evaluated in the context of the global COVID-19 pandemic, which necessitated consideration that the patient might be at risk for infection with the SARS-CoV-2 virus that causes COVID-19. Institutional protocols and algorithms that pertain to the evaluation of patients at risk for COVID-19 are in a state of rapid change based on information released by regulatory bodies including the CDC and federal and state organizations. These policies and algorithms were followed during the patient's care in the ED.  Some ED evaluations and interventions may be delayed as a result of limited staffing during and after the pandemic.*  Note:  This document was prepared using Dragon voice recognition software and may include unintentional dictation errors.   Loleta Rose, MD 05/11/20 [phone]

## 2020-05-11 NOTE — Discharge Instructions (Signed)
You were seen in the Emergency Department (ED) today for a head injury.  Based on your evaluation, you may have sustained a concussion (or bruise) to your brain.  Your CT scan did not show any evidence of serious injury or bleeding.    Symptoms to expect from a concussion include nausea, mild to moderate headache, difficulty concentrating or sleeping, and mild lightheadedness.  These symptoms should improve over the next few days to weeks, but it may take many weeks before you feel back to normal.  Return to the emergency department or follow-up with your primary care doctor if your symptoms are not improving over this time.  Signs of a more serious head injury include vomiting, severe headache, excessive sleepiness or confusion, and weakness or numbness in your face, arms or legs.  Return immediately to the Emergency Department if you experience any of these more concerning symptoms.    Rest, avoid strenuous physical or mental activity, and avoid activities that could potentially result in another head injury until all your symptoms from this head injury are completely resolved for at least 2-3 weeks.  If you participate in sports, get cleared by your doctor or trainer before returning to play.  You may take ibuprofen or acetaminophen over the counter according to label instructions for mild headache or scalp soreness.

## 2020-05-11 NOTE — ED Notes (Signed)
Patient verbalizes understanding of discharge instructions. Opportunity for questioning and answers were provided. Armband removed by staff, pt discharged from ED. Ambulated out to lobby  

## 2020-05-12 NOTE — Telephone Encounter (Signed)
Can you schedule

## 2020-05-13 ENCOUNTER — Other Ambulatory Visit: Payer: Self-pay

## 2020-05-13 ENCOUNTER — Encounter: Payer: Self-pay | Admitting: Obstetrics and Gynecology

## 2020-05-13 ENCOUNTER — Ambulatory Visit (INDEPENDENT_AMBULATORY_CARE_PROVIDER_SITE_OTHER): Payer: BC Managed Care – PPO | Admitting: Obstetrics and Gynecology

## 2020-05-13 VITALS — Ht 59.0 in | Wt 144.8 lb

## 2020-05-13 DIAGNOSIS — F32A Depression, unspecified: Secondary | ICD-10-CM | POA: Diagnosis not present

## 2020-05-13 DIAGNOSIS — E663 Overweight: Secondary | ICD-10-CM

## 2020-05-13 DIAGNOSIS — Z6829 Body mass index (BMI) 29.0-29.9, adult: Secondary | ICD-10-CM | POA: Diagnosis not present

## 2020-05-13 DIAGNOSIS — F419 Anxiety disorder, unspecified: Secondary | ICD-10-CM | POA: Diagnosis not present

## 2020-05-13 MED ORDER — VENLAFAXINE HCL ER 150 MG PO CP24: 150.0000 mg | ORAL_CAPSULE | Freq: Every day | ORAL | 3 refills | Status: DC

## 2020-05-13 NOTE — Telephone Encounter (Signed)
Patient is scheduled for today 05/13/20 with AMS for phone medication follow up

## 2020-05-13 NOTE — Progress Notes (Signed)
I connected with Krista Daniels on 05/14/20 at  3:30 PM EST by telephone and verified that I am speaking with the correct person using two identifiers.   I discussed the limitations, risks, security and privacy concerns of performing an evaluation and management service by telephone and the availability of in person appointments. I also discussed with the patient that there may be a patient responsible charge related to this service. The patient expressed understanding and agreed to proceed.  The patient was at home I spoke with the patient from my workstation phone The names of people involved in this encounter were: Krista Daniels , and [name]   Gynecology Office Visit  Chief Complaint:  Chief Complaint  Patient presents with   Weight Check    History of Present Illness: Patientis a 34 y.o. [id number] female, who presents for the evaluation of the desire to lose weight. She has lost 1 pounds 1 months. The patient states the following symptoms since starting her weight loss therapy: appetite suppression, energy, and weight loss.  The patient also reports no other ill effects. The patient specifically denies heart palpitations, anxiety, and insomnia.    Review of Systems: 10 point review of systems negative unless otherwise noted in HPI  Past Medical History:  Past Medical History:  Diagnosis Date   Anxiety    Anxiety    BV (bacterial vaginosis)    Yeast vaginitis     Past Surgical History:  Past Surgical History:  Procedure Laterality Date   CESAREAN SECTION  2008, 2010    Gynecologic History: Patient's last menstrual period was 04/22/2020 (exact date).  Obstetric History: [id number]  Family History:  Family History  Problem Relation Age of Onset   Breast cancer Maternal Grandmother    Diabetes Maternal Grandmother    Healthy Mother    Heart attack Father    Colon cancer Maternal Grandfather 87   Breast cancer Maternal Great-grandmother 66     Social History:  Social History   Socioeconomic History   Marital status: Married    Spouse name: Not on file   Number of children: Not on file   Years of education: Not on file   Highest education level: Not on file  Occupational History   Not on file  Tobacco Use   Smoking status: Never Smoker   Smokeless tobacco: Never Used  Vaping Use   Vaping Use: Never used  Substance and Sexual Activity   Alcohol use: Yes    Comment: socially   Drug use: No   Sexual activity: Yes    Birth control/protection: None  Other Topics Concern   Not on file  Social History Narrative   Not on file   Social Determinants of Health   Financial Resource Strain:    Difficulty of Paying Living Expenses: Not on file  Food Insecurity:    Worried About Programme researcher, broadcasting/film/video in the Last Year: Not on file   [employer] of Food in the Last Year: Not on file  Transportation Needs:    Lack of Transportation (Medical): Not on file   Lack of Transportation (Non-Medical): Not on file  Physical Activity:    Days of Exercise per Week: Not on file   Minutes of Exercise per Session: Not on file  Stress:    Feeling of Stress : Not on file  Social Connections:    Frequency of Communication with Friends and Family: Not on file   Frequency of Social Gatherings with Friends  and Family: Not on file   Attends Religious Services: Not on file   Active Member of Clubs or Organizations: Not on file   Attends Banker Meetings: Not on file   Marital Status: Not on file  Intimate Partner Violence:    Fear of Current or Ex-Partner: Not on file   Emotionally Abused: Not on file   Physically Abused: Not on file   Sexually Abused: Not on file    Allergies:  Allergies  Allergen Reactions   Azithromycin    Bee Venom    Hydrocodone     Medications: Prior to Admission medications   Medication Sig Start Date End Date Taking? Authorizing Provider  albuterol (VENTOLIN  HFA) 108 (90 Base) MCG/ACT inhaler Inhale into the lungs. 04/26/20 04/26/21 Yes [provider]  ipratropium (ATROVENT) 0.03 % nasal spray Place into the nose. 04/22/20 04/22/21 Yes [provider]  venlafaxine XR (EFFEXOR-XR) 150 MG 24 hr capsule Take 150 mg by mouth daily. 05/09/20  Yes [provider]  zolpidem (AMBIEN) 10 MG tablet Take 10 mg by mouth at bedtime as needed. 02/19/20  Yes [provider]    Physical Exam Height 4\' 11"  (1.499 m), weight 144 lb 12.8 oz (65.7 kg), last menstrual period 04/22/2020. Wt Readings from Last 3 Encounters:  05/13/20 144 lb 12.8 oz (65.7 kg)  05/11/20 145 lb (65.8 kg)  04/16/20 144 lb (65.3 kg)  Body mass index is 29.25 kg/m.   No physical exam as this was a remote telephone visit to promote social distancing during the current COVID-19 Pandemic   Assessment: 34 y.o. 31 medical weight loss follow up Plan: Problem List Items Addressed This Visit    None    Visit Diagnoses    Anxiety and depression    -  Primary   Relevant Medications   venlafaxine XR (EFFEXOR-XR) 150 MG 24 hr capsule   Overweight (BMI 25.0-29.9)       BMI 29.0-29.9,adult          1) 1500 Calorie ADA Diet  2) Patient education given regarding appropriate lifestyle changes for weight loss including: regular physical activity, healthy coping strategies, caloric restriction and healthy eating patterns.  3) No further appreciable weight loss with switch to diethylproprion - stop diethlyproprion, continue effexor at 150mg  daily  5) Telephone time 10:32min  6)  Return in about 2 months (around 07/13/2020) for medication follow up.    34m, MD, 09/10/2020 [facility], [facility] 05/13/2020, 4:13 PM

## 2020-07-19 ENCOUNTER — Ambulatory Visit: Payer: BC Managed Care – PPO | Admitting: Obstetrics and Gynecology

## 2020-07-26 ENCOUNTER — Encounter: Payer: Self-pay | Admitting: Obstetrics and Gynecology

## 2020-07-26 ENCOUNTER — Other Ambulatory Visit: Payer: Self-pay

## 2020-07-26 ENCOUNTER — Ambulatory Visit (INDEPENDENT_AMBULATORY_CARE_PROVIDER_SITE_OTHER): Payer: BC Managed Care – PPO | Admitting: Obstetrics and Gynecology

## 2020-07-26 VITALS — BP 132/78 | Ht 59.0 in | Wt 148.0 lb

## 2020-07-26 DIAGNOSIS — Z6829 Body mass index (BMI) 29.0-29.9, adult: Secondary | ICD-10-CM | POA: Diagnosis not present

## 2020-07-26 DIAGNOSIS — E663 Overweight: Secondary | ICD-10-CM

## 2020-07-26 DIAGNOSIS — R635 Abnormal weight gain: Secondary | ICD-10-CM

## 2020-07-26 DIAGNOSIS — Z9229 Personal history of other drug therapy: Secondary | ICD-10-CM | POA: Diagnosis not present

## 2020-07-26 MED ORDER — PHENTERMINE HCL 37.5 MG PO TABS: 37.5000 mg | ORAL_TABLET | Freq: Every day | ORAL | 0 refills | Status: DC

## 2020-07-26 NOTE — Progress Notes (Signed)
Gynecology Office Visit  Chief Complaint:  Chief Complaint  Patient presents with  . Follow-up    Medication - RM 4    History of Present Illness: Patientis a 35 y.o. [id number] female, who presents for the evaluation of weight gain. She has gained 4 pounds primarily over 3 months. The patient states the following issues have contributed to her weight problem: none.  The patient has no additional symptoms. The patient specifically denies memory loss, muscle weakness, excessive thirst, and polyuria. Weight related co-morbidities include none. She has tried phentermine and diethylproprion interventions in the past with modest success.   Review of Systems: 10 point review of systems negative unless otherwise noted in HPI  Past Medical History:  Patient Active Problem List   Diagnosis Date Noted  . Acne 04/16/2020  . Fibromyalgia 04/16/2020  . Cardiac murmur 12/30/2019  . Abnormal posture 06/04/2019  . Bursitis of shoulder 06/04/2019  . Edema 06/04/2019  . Muscle weakness 06/04/2019  . Shoulder joint pain 06/04/2019  . Sprain of acromioclavicular ligament 06/04/2019  . Anxiety state 08/10/2017    Formatting of this note might be different from the original. Failed lexapro. zoloft started 08/2017   . Other insomnia 08/10/2017    Formatting of this note might be different from the original. Failed uncertain dose of amitriptyline in distant past. Failed 150 mg trazodone. Doxepin started 3/19   . Left foot pain 11/04/2015  . Left arm pain 03/21/2013    Last Assessment & Plan:  Formatting of this note might be different from the original. xrays of LUE w/o fracture or dislocation. Consult with AA who wants to r/o acute rotatory cuff tear. AA advised Orthopedic consult ASAP. Copy of xrays on disc given to pt. Return to work to be determined by orthopedics. Formatting of this note might be different from the original. Last Assessment & Plan:  xrays of LUE w/o fracture or dislocation.  Consult with AA who wants to r/o acute rotatory cuff tear. AA advised Orthopedic consult ASAP. Copy of xrays on disc given to pt. Return to work to be determined by orthopedics.   . 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 CESAREAN SECTION  2008, 2010    Gynecologic History: Patient's last menstrual period was 07/25/2020.  Obstetric History: [id number]  Family History:  Family History  Problem Relation Age of Onset  . Breast cancer Maternal Grandmother   . Diabetes Maternal Grandmother   . Healthy Mother   . Heart attack Father   . Colon cancer Maternal Grandfather 58  . Breast cancer Maternal Great-grandmother 1    Social History:  Social History   Socioeconomic History  . Marital status: Married    Spouse name: Not on file  . Number of children: Not on file  . Years of education: Not on file  . Highest education level: Not on file  Occupational History  . Not on file  Tobacco Use  . Smoking status: Never Smoker  . Smokeless tobacco: Never Used  Vaping Use  . Vaping Use: Never used  Substance and Sexual Activity  . Alcohol use: Yes    Comment: socially  . Drug use: No  . Sexual activity: Yes    Birth control/protection: None  Other Topics Concern  . Not on file  Social History Narrative  . Not on file   Social Determinants of Health   Financial Resource Strain: Not on file  Food Insecurity: Not on file  Transportation Needs: Not on file  Physical Activity: Not on file  Stress: Not on file  Social Connections: Not on file  Intimate Partner Violence: Not on file    Allergies:  Allergies  Allergen Reactions  . Azithromycin   . Bee Venom   . Hydrocodone     Medications: Prior to Admission medications   Medication Sig Start Date End Date Taking? Authorizing Provider  albuterol (VENTOLIN HFA) 108 (90 Base) MCG/ACT inhaler Inhale into the lungs. 04/26/20 04/26/21 Yes [provider]  gabapentin (NEURONTIN) 100 MG capsule Take by mouth. 07/06/20 07/06/21 Yes [provider]  ipratropium (ATROVENT) 0.03 % nasal spray Place into the nose. 04/22/20 04/22/21 Yes [provider]  meloxicam (MOBIC) 7.5 MG tablet Take 7.5 mg by mouth daily. 06/08/20  Yes [provider]  nortriptyline (PAMELOR) 10 MG capsule Take by mouth. 06/15/20 06/15/21 Yes [provider]  SUMAtriptan (IMITREX) 25 MG tablet Take by mouth. 07/26/20  Yes [provider]  venlafaxine XR (EFFEXOR-XR) 150 MG 24 hr capsule Take 1 capsule (150 mg total) by mouth daily. 05/13/20  Yes [name], MD  zolpidem (AMBIEN) 10 MG tablet Take 10 mg by mouth at bedtime as needed. Patient not taking: Reported on 07/26/2020 02/19/20   [provider]    Physical Exam Blood pressure 132/78, height 4\' 11"  (1.499 m), weight 148 lb (67.1 kg), last menstrual period 07/25/2020. Body mass index is 29.89 kg/m. Patient's last menstrual period was 07/25/2020.  General: NAD HEENT: normocephalic, anicteric Thyroid: no enlargement Pulmonary: no increased work of breathing Neurologic: Grossly intact Psychiatric: mood appropriate, affect full  Assessment: 35 y.o. 31 presenting for discussion of weight loss management options  Plan: Problem List Items Addressed This Visit   None   Visit Diagnoses    Overweight (BMI 25.0-29.9)    -  Primary   BMI 29.0-29.9,adult          1) 1500 Calorie ADA Diet  2) Patient education given regarding appropriate lifestyle changes for weight loss including: regular physical activity, healthy coping strategies, caloric restriction and healthy eating patterns.  3) Patient will be started on weight loss medication. The risks and benefits and side effects of medication, such as Adipex (Phenteramine) ,  Tenuate (Diethylproprion), Belviq (lorcarsin), Contrave (buproprion/naltrexone), Qsymia (phentermine/topiramate), and Saxenda  (liraglutide) is discussed. The pros and cons of suppressing appetite and boosting metabolism is discussed. Risks of tolerence and addiction is discussed for selected agents discussed. Use of medicine will ne short term, such as 3-4 months at a time followed by a period of time off of the medicine to avoid these risks and side effects for Adipex, Qsymia, and Tenuate discussed. Pt to call with any negative side effects and agrees to keep follow up appts.  4) Encouraged weekly weight monitorig to track progress and sample 1 week food diary  5) 15 minutes face-to-face; counseling/coordination of care > 50 percent of visit  6) Return in about 4 weeks (around 08/23/2020) for medication follow up (phone, video, or in person).   08/25/2020, MD, [name] [facility], [facility] 07/26/2020, 2:45 PM

## 2020-08-16 ENCOUNTER — Other Ambulatory Visit: Payer: Self-pay | Admitting: Neurology

## 2020-08-16 DIAGNOSIS — F0781 Postconcussional syndrome: Secondary | ICD-10-CM

## 2020-08-23 ENCOUNTER — Encounter: Payer: Self-pay | Admitting: Obstetrics and Gynecology

## 2020-08-23 ENCOUNTER — Other Ambulatory Visit: Payer: Self-pay

## 2020-08-23 ENCOUNTER — Ambulatory Visit (INDEPENDENT_AMBULATORY_CARE_PROVIDER_SITE_OTHER): Payer: BC Managed Care – PPO | Admitting: Obstetrics and Gynecology

## 2020-08-23 VITALS — Ht 59.0 in | Wt 153.0 lb

## 2020-08-23 DIAGNOSIS — Z713 Dietary counseling and surveillance: Secondary | ICD-10-CM

## 2020-08-23 DIAGNOSIS — Z6830 Body mass index (BMI) 30.0-30.9, adult: Secondary | ICD-10-CM | POA: Diagnosis not present

## 2020-08-23 DIAGNOSIS — E669 Obesity, unspecified: Secondary | ICD-10-CM | POA: Diagnosis not present

## 2020-08-23 MED ORDER — PHENTERMINE HCL 37.5 MG PO TABS: 37.5000 mg | ORAL_TABLET | Freq: Every day | ORAL | 0 refills | Status: DC

## 2020-08-23 NOTE — Progress Notes (Signed)
I connected with Krista Daniels on 08/23/20 at  9:10 AM EST by telephone and verified that I am speaking with the correct person using two identifiers.   I discussed the limitations, risks, security and privacy concerns of performing an evaluation and management service by telephone and the availability of in person appointments. I also discussed with the patient that there may be a patient responsible charge related to this service. The patient expressed understanding and agreed to proceed.  The patient was at home I spoke with the patient from my workstation phone The names of people involved in this encounter were: Krista Daniels , and [name]   Gynecology Office Visit  Chief Complaint:  Chief Complaint  Patient presents with  . Follow-up    Phone visit F/U medication, no concerns.    History of Present Illness: Patientis a 35 y.o. [id number] female, who presents for the evaluation of the desire to lose weight. She has gained 5lbs over the past month. The patient states the following symptoms since starting her weight loss therapy: appetite suppression, energy, and weight loss.  The patient also reports no other ill effects. The patient specifically denies heart palpitations, anxiety, and insomnia.   Steroid taper recently, 17 days.  Just finished   Review of Systems: 10 point review of systems negative unless otherwise noted in HPI  Past Medical History:  Past Medical History:  Diagnosis Date  . Anxiety   . Anxiety   . BV (bacterial vaginosis)   . Yeast vaginitis     Past Surgical History:  Past Surgical History:  Procedure Laterality Date  . CESAREAN SECTION  2008, 2010   Gynecologic History: Patient's last menstrual period was 07/25/2020.  Obstetric History: [id number]  Family History:  Family History  Problem Relation Age of Onset  . Breast cancer Maternal Grandmother   . Diabetes Maternal Grandmother   . Healthy Mother   . Heart attack Father   . Colon  cancer Maternal Grandfather 85  . Breast cancer Maternal Great-grandmother 48   Social History:  Social History   Socioeconomic History  . Marital status: Married    Spouse name: Not on file  . Number of children: Not on file  . Years of education: Not on file  . Highest education level: Not on file  Occupational History  . Not on file  Tobacco Use  . Smoking status: Never Smoker  . Smokeless tobacco: Never Used  Vaping Use  . Vaping Use: Never used  Substance and Sexual Activity  . Alcohol use: Yes    Comment: socially  . Drug use: No  . Sexual activity: Yes    Birth control/protection: None  Other Topics Concern  . Not on file  Social History Narrative  . Not on file   Social Determinants of Health   Financial Resource Strain: Not on file  Food Insecurity: Not on file  Transportation Needs: Not on file  Physical Activity: Not on file  Stress: Not on file  Social Connections: Not on file  Intimate Partner Violence: Not on file    Allergies:  Allergies  Allergen Reactions  . Azithromycin   . Bee Venom   . Hydrocodone     Medications: Prior to Admission medications   Medication Sig Start Date End Date Taking? Authorizing Provider  albuterol (VENTOLIN HFA) 108 (90 Base) MCG/ACT inhaler Inhale into the lungs. 04/26/20 04/26/21 Yes [provider]  gabapentin (NEURONTIN) 100 MG capsule Take by mouth. 07/06/20 07/06/21 Yes  [provider]  ipratropium (ATROVENT) 0.03 % nasal spray Place into the nose. 04/22/20 04/22/21 Yes [provider]  meloxicam (MOBIC) 7.5 MG tablet Take 7.5 mg by mouth daily. 06/08/20  Yes [provider]  nortriptyline (PAMELOR) 10 MG capsule Take by mouth. 06/15/20 06/15/21 Yes [provider]  phentermine (ADIPEX-P) 37.5 MG tablet Take 1 tablet (37.5 mg total) by mouth daily before breakfast. 07/26/20  Yes [name], MD  SUMAtriptan (IMITREX) 25 MG tablet Take by mouth. 07/26/20  Yes [provider]  venlafaxine XR (EFFEXOR-XR) 150 MG 24 hr capsule Take 1 capsule (150 mg total) by mouth daily. 05/13/20  Yes [name], MD  Vitamin D, Ergocalciferol, (DRISDOL) 1.25 MG (50000 UNIT) CAPS capsule Take 50,000 Units by mouth once a week. 08/20/20  Yes [provider]  zolpidem (AMBIEN) 10 MG tablet Take 10 mg by mouth at bedtime as needed. Patient not taking: Reported on 07/26/2020 02/19/20   [provider]    Physical Exam Height 4\' 11"  (1.499 m), weight 153 lb (69.4 kg), last menstrual period 07/25/2020. Wt Readings from Last 3 Encounters:  08/23/20 153 lb (69.4 kg)  07/26/20 148 lb (67.1 kg)  05/13/20 144 lb 12.8 oz (65.7 kg)  Body mass index is 30.9 kg/m.  No physical exam as this was a remote telephone visit to promote social distancing during the current COVID-19 Pandemic  Assessment: 35 y.o. 31 patient presenting for follow up medical weight loss  Plan: Problem List Items Addressed This Visit   None   Visit Diagnoses    Class 1 obesity without serious comorbidity with body mass index (BMI) of 30.0 to 30.9 in adult, unspecified obesity type    -  Primary   Relevant Medications   phentermine (ADIPEX-P) 37.5 MG tablet      1) 1500 Calorie ADA Diet  2) Patient education given regarding appropriate lifestyle changes for weight loss including: regular physical activity, healthy coping strategies, caloric restriction and healthy eating patterns.  3) Patient will be started on weight loss medication. The risks and benefits and side effects of medication, such as Adipex (Phenteramine) ,  Tenuate (Diethylproprion), Belviq (lorcarsin), Contrave (buproprion/naltrexone), Qsymia (phentermine/topiramate), and Saxenda (liraglutide) is discussed. The pros and cons of suppressing appetite and boosting metabolism is discussed. Risks of tolerence and addiction is discussed for selected agents discussed. Use of medicine will ne short term, such as 3-4  months at a time followed by a period of time off of the medicine to avoid these risks and side effects for Adipex, Qsymia, and Tenuate discussed. Pt to call with any negative side effects and agrees to keep follow up appts.  4) Patient to take medication, with the benefits of appetite suppression and metabolism boost d/w pt, along with the side effects and risk factors of long term use that will be avoided with our use of short bursts of therapy. Rx provided.    5) Telephone Time 6:59min  6) Return in about 4 weeks (around 09/20/2020) for medication follow.     09/22/2020, MD, [name] [facility], [facility] 08/23/2020, 4:37 PM

## 2020-09-20 ENCOUNTER — Other Ambulatory Visit: Payer: Self-pay

## 2020-09-20 ENCOUNTER — Ambulatory Visit (INDEPENDENT_AMBULATORY_CARE_PROVIDER_SITE_OTHER): Payer: BC Managed Care – PPO | Admitting: Obstetrics and Gynecology

## 2020-09-20 VITALS — Wt 151.0 lb

## 2020-09-20 DIAGNOSIS — Z6830 Body mass index (BMI) 30.0-30.9, adult: Secondary | ICD-10-CM

## 2020-09-20 DIAGNOSIS — Z79899 Other long term (current) drug therapy: Secondary | ICD-10-CM

## 2020-09-20 DIAGNOSIS — E669 Obesity, unspecified: Secondary | ICD-10-CM | POA: Diagnosis not present

## 2020-09-20 MED ORDER — INSULIN PEN NEEDLE 32G X 6 MM MISC: 1.0000 [IU] | 3 refills | Status: DC

## 2020-09-20 MED ORDER — SAXENDA 18 MG/3ML ~~LOC~~ SOPN: PEN_INJECTOR | SUBCUTANEOUS | 2 refills | Status: DC

## 2020-09-20 NOTE — Progress Notes (Signed)
I connected with Krista Daniels on 09/20/20 at  9:10 AM EDT by telephone and verified that I am speaking with the correct person using two identifiers.   I discussed the limitations, risks, security and privacy concerns of performing an evaluation and management service by telephone and the availability of in person appointments. I also discussed with the patient that there may be a patient responsible charge related to this service. The patient expressed understanding and agreed to proceed.  The patient was at home I spoke with the patient from my workstation phone The names of people involved in this encounter were: Krista Daniels , and [name]   Gynecology Office Visit  Chief Complaint: No chief complaint on file.   History of Present Illness: Patientis a 35 y.o. [id number] female, who presents for the evaluation of the desire to lose weight. She has lost 2 pounds 1 months. The patient feels appetite suppression, energy, and weight loss less prominent this month.  The patient also reports no other ill effects. The patient specifically denies heart palpitations, anxiety, and insomnia.    Review of Systems: 10 point review of systems negative unless otherwise noted in HPI  Past Medical History:  Past Medical History:  Diagnosis Date  . Anxiety   . Anxiety   . BV (bacterial vaginosis)   . Yeast vaginitis     Past Surgical History:  Past Surgical History:  Procedure Laterality Date  . CESAREAN SECTION  2008, 2010    Gynecologic History: No LMP recorded.  Obstetric History: [id number]  Family History:  Family History  Problem Relation Age of Onset  . Breast cancer Maternal Grandmother   . Diabetes Maternal Grandmother   . Healthy Mother   . Heart attack Father   . Colon cancer Maternal Grandfather 22  . Breast cancer Maternal Great-grandmother 69    Social History:  Social History   Socioeconomic History  . Marital status: Married    Spouse name: Not on file   . Number of children: Not on file  . Years of education: Not on file  . Highest education level: Not on file  Occupational History  . Not on file  Tobacco Use  . Smoking status: Never Smoker  . Smokeless tobacco: Never Used  Vaping Use  . Vaping Use: Never used  Substance and Sexual Activity  . Alcohol use: Yes    Comment: socially  . Drug use: No  . Sexual activity: Yes    Birth control/protection: None  Other Topics Concern  . Not on file  Social History Narrative  . Not on file   Social Determinants of Health   Financial Resource Strain: Not on file  Food Insecurity: Not on file  Transportation Needs: Not on file  Physical Activity: Not on file  Stress: Not on file  Social Connections: Not on file  Intimate Partner Violence: Not on file    Allergies:  Allergies  Allergen Reactions  . Azithromycin   . Bee Venom   . Hydrocodone     Medications: Prior to Admission medications   Medication Sig Start Date End Date Taking? Authorizing Provider  albuterol (VENTOLIN HFA) 108 (90 Base) MCG/ACT inhaler Inhale into the lungs. 04/26/20 04/26/21  [provider]  gabapentin (NEURONTIN) 100 MG capsule Take by mouth. 07/06/20 07/06/21  [provider]  ipratropium (ATROVENT) 0.03 % nasal spray Place into the nose. 04/22/20 04/22/21  [provider]  meloxicam (MOBIC) 7.5 MG tablet Take 7.5 mg by mouth  daily. 06/08/20   [provider]  nortriptyline (PAMELOR) 10 MG capsule Take by mouth. 06/15/20 06/15/21  [provider]  phentermine (ADIPEX-P) 37.5 MG tablet Take 1 tablet (37.5 mg total) by mouth daily before breakfast. 08/23/20   [name], MD  SUMAtriptan (IMITREX) 25 MG tablet Take by mouth. 07/26/20   [provider]  venlafaxine XR (EFFEXOR-XR) 150 MG 24 hr capsule Take 1 capsule (150 mg total) by mouth daily. 05/13/20   [name], MD  Vitamin D, Ergocalciferol, (DRISDOL) 1.25 MG (50000 UNIT) CAPS capsule  Take 50,000 Units by mouth once a week. 08/20/20   [provider]  zolpidem (AMBIEN) 10 MG tablet Take 10 mg by mouth at bedtime as needed. Patient not taking: Reported on 07/26/2020 02/19/20   [provider]    Physical Exam There were no vitals taken for this visit. Wt Readings from Last 3 Encounters:  09/20/20 151 lb (68.5 kg)  08/23/20 153 lb (69.4 kg)  07/26/20 148 lb (67.1 kg)  Body mass index is 30.5 kg/m.   No physical exam as this was a remote telephone visit to promote social distancing during the current COVID-19 Pandemic   Assessment: 35 y.o. [id number] medical weight loss follow up  Plan: Problem List Items Addressed This Visit   None   Visit Diagnoses    Class 1 obesity without serious comorbidity with body mass index (BMI) of 30.0 to 30.9 in adult, unspecified obesity type    -  Primary   Relevant Medications   Liraglutide -Weight Management (SAXENDA) 18 MG/3ML SOPN      1) 1500 Calorie ADA Diet  2) Patient education given regarding appropriate lifestyle changes for weight loss including: regular physical activity, healthy coping strategies, caloric restriction and healthy eating patterns.  3) Patient to take medication, with the benefits of appetite suppression and metabolism boost d/w pt, along with the side effects and risk factors of long term use that will be avoided with our use of short bursts of therapy. Rx provided.   - Switch saxenda secondary to decreased appetite suppression on phentermine  4) Telephone time 5:41min   5)  Return in about 3 months (around 12/21/2020) for medication follow up .    [name], MD, [facility], [facility] 09/20/2020, 9:38 AM

## 2020-10-06 ENCOUNTER — Ambulatory Visit: Payer: BC Managed Care – PPO

## 2020-10-08 ENCOUNTER — Telehealth: Payer: Self-pay

## 2020-10-08 NOTE — Telephone Encounter (Signed)
Patient f/u. States she spoke w/nurse yesterday in regards to having a Brain MRI w/Contrast scheduled. She was advised AMS wasn't in the office until today. OI#[phone]

## 2020-10-16 ENCOUNTER — Other Ambulatory Visit: Payer: Self-pay

## 2020-10-16 ENCOUNTER — Ambulatory Visit
Admission: RE | Admit: 2020-10-16 | Discharge: 2020-10-16 | Disposition: A | Discharge status: Home / Self Care | Payer: BC Managed Care – PPO | Source: Ambulatory Visit | Attending: Neurology | Admitting: Neurology

## 2020-10-16 DIAGNOSIS — F0781 Postconcussional syndrome: Secondary | ICD-10-CM | POA: Insufficient documentation

## 2020-10-16 MED ORDER — GADOBUTROL 1 MMOL/ML IV SOLN
7.0000 mL | Freq: Once | INTRAVENOUS | Status: AC | PRN
  Administered 2020-10-16: 7 mL via INTRAVENOUS

## 2020-11-18 ENCOUNTER — Other Ambulatory Visit: Payer: Self-pay | Admitting: Obstetrics and Gynecology

## 2020-11-18 MED ORDER — FLUCONAZOLE 150 MG PO TABS
150.0000 mg | ORAL_TABLET | Freq: Once | ORAL | 0 refills | Status: DC
Start: 2020-11-18 — End: 2021-07-28

## 2020-12-11 ENCOUNTER — Other Ambulatory Visit: Payer: Self-pay

## 2021-04-24 DIAGNOSIS — G4733 Obstructive sleep apnea (adult) (pediatric): Secondary | ICD-10-CM | POA: Insufficient documentation

## 2021-06-26 ENCOUNTER — Encounter: Payer: Self-pay | Admitting: Obstetrics and Gynecology

## 2021-07-27 NOTE — Progress Notes (Signed)
PCP:  [facility]   Chief Complaint  Patient presents with   Gynecologic Exam   Vaginal Discharge    Itchiness, no irritation or odor. Always happens after cycle ends      HPI:      [name] is a 36 y.o. [id number] whose LMP was Patient's last menstrual period was 07/16/2021 (exact date)., presents today for her annual examination.  Her menses are monthly, lasting 5-7 days, mod flow, no BTB, mild dysmen/mood changes with periods.   Sex activity: single partner, contraception - none. Declines BC. No pain/bleeding.  Last Pap: 06/27/18  Results were: no abnormalities /neg HPV DNA   Hx of vag d/c with itchiness, no odor week before menses, treats with diflucan before and after menses with sx control; pt needs Rx RF. No sx today. Hx of BV and yeast in past.   There is no FH of breast cancer. There is no FH of ovarian cancer. The patient does do self-breast exams.  Tobacco use: The patient denies current or previous tobacco use. Alcohol use: social drinker No drug use.  Exercise: moderately active  She does get adequate calcium but not Vitamin D in her diet. Hx of Vit D deficiency with Vit D Rx with PCP in past. Not taking any supp now.   Has issues with nocturia, no other UTI sx. Had recent urine check with PCP. Drinks caffeine in afternoon.   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    Past Surgical History:  Procedure Laterality Date   CESAREAN SECTION  2008, 2010    Family History  Problem Relation Age of Onset   Healthy Mother     Heart attack Father    Breast cancer Maternal Grandmother 75   Diabetes Maternal Grandmother    Colon cancer Maternal Grandfather 35   Breast cancer Maternal Great-grandmother        early years    Social History   Socioeconomic History   Marital status: Married    Spouse name: Not on file   Number of children: Not on file   Years of education: Not on file   Highest education level: Not on file  Occupational History   Not on file  Tobacco Use   Smoking status: Never   Smokeless tobacco: Never  Vaping Use   Vaping Use: Never used  Substance and Sexual Activity   Alcohol use: Yes    Comment: socially   Drug use: No   Sexual activity: Yes    Birth control/protection: None  Other Topics Concern   Not on file  Social History Narrative   Not on file   Social Determinants of Health   Financial Resource Strain: Not on file  Food Insecurity: Not on file  Transportation Needs: Not on file  Physical Activity: Not on file  Stress: Not on file  Social Connections: Not on file  Intimate Partner Violence: Not on file     Current Outpatient Medications:    buPROPion ER (WELLBUTRIN SR) 100 MG 12 hr tablet,  Take 100 mg by mouth 2 (two) times daily., Disp: , Rfl:    EPINEPHrine 0.3 mg/0.3 mL IJ SOAJ injection, , Disp: , Rfl:    fluticasone (FLONASE) 50 MCG/ACT nasal spray, Place 2 sprays into both nostrils daily., Disp: , Rfl:    lithium carbonate (LITHOBID) 300 MG CR tablet, Take 300 mg by mouth 2 (two) times daily., Disp: , Rfl:    methylphenidate (RITALIN LA) 20 MG 24 hr capsule, Take 20 mg by mouth 2 (two) times daily., Disp: , Rfl:    ondansetron (ZOFRAN-ODT) 4 MG disintegrating tablet, Take by mouth., Disp: , Rfl:    SUMAtriptan (IMITREX) 25 MG tablet, Take by mouth., Disp: , Rfl:    albuterol (VENTOLIN HFA) 108 (90 Base) MCG/ACT inhaler, Inhale into the lungs., Disp: , Rfl:    fluconazole (DIFLUCAN) 150 MG tablet, Take 1 tablet (150 mg total) by mouth once for 1 dose.  Before and after period, monthly for treatment, Disp: 13 tablet, Rfl: 1     ROS:  Review of Systems  Constitutional:  Negative for fatigue, fever and unexpected weight change.  Respiratory:  Negative for cough, shortness of breath and wheezing.   Cardiovascular:  Negative for chest pain, palpitations and leg swelling.  Gastrointestinal:  Negative for blood in stool, constipation, diarrhea, nausea and vomiting.  Endocrine: Negative for cold intolerance, heat intolerance and polyuria.  Genitourinary:  Positive for frequency and vaginal discharge. Negative for dyspareunia, dysuria, flank pain, genital sores, hematuria, menstrual problem, pelvic pain, urgency, vaginal bleeding and vaginal pain.  Musculoskeletal:  Negative for back pain, joint swelling and myalgias.  Skin:  Negative for rash.  Neurological:  Negative for dizziness, syncope, light-headedness, numbness and headaches.  Hematological:  Negative for adenopathy.  Psychiatric/Behavioral:  Positive for agitation and dysphoric mood. Negative for confusion, sleep disturbance and suicidal ideas. The patient is not nervous/anxious.   BREAST: No symptoms   Objective: BP 120/70    Ht 4\' 11"  (1.499 m)    Wt 146 lb (66.2 kg)    LMP 07/16/2021 (Exact Date)    BMI 29.49 kg/m    Physical Exam Constitutional:      Appearance: She is well-developed.  Genitourinary:     Vulva normal.     Right Labia: No rash, tenderness or lesions.    Left Labia: No tenderness, lesions or rash.    No vaginal discharge, erythema or tenderness.      Right Adnexa: not tender and no mass present.    Left Adnexa: not tender and no mass present.    No cervical friability or polyp.     Uterus is not enlarged or tender.  Breasts:    Right: No mass, nipple discharge, skin change or tenderness.     Left: No mass, nipple discharge, skin change or tenderness.  Neck:     Thyroid: No thyromegaly.  Cardiovascular:     Rate and Rhythm: Normal rate and regular  rhythm.     Heart sounds: Normal heart sounds. No murmur heard. Pulmonary:     Effort: Pulmonary effort is normal.     Breath sounds: Normal breath sounds.  Abdominal:     Palpations: Abdomen is soft.     Tenderness: There is no abdominal tenderness. There is no guarding or rebound.  Musculoskeletal:        General: Normal range of motion.     Cervical back: Normal range of motion.  Lymphadenopathy:     Cervical: No cervical adenopathy.  Neurological:  General: No focal deficit present.     Mental Status: She is alert and oriented to person, place, and time.     Cranial Nerves: No cranial nerve deficit.  Skin:    General: Skin is warm and dry.  Psychiatric:        Mood and Affect: Mood normal.        Behavior: Behavior normal.        Thought Content: Thought content normal.        Judgment: Judgment normal.  Vitals reviewed.    Assessment/Plan: Encounter for annual routine gynecological examination  Yeast vaginitis - Plan: fluconazole (DIFLUCAN) 150 MG tablet; recurrent sx with menses. Rx diflucan before and after menses monthly. F/u prn.   Encouraged condom use due to multiple meds; add PNVs. F/u prn.   Meds ordered this encounter  Medications   fluconazole (DIFLUCAN) 150 MG tablet    Sig: Take 1 tablet (150 mg total) by mouth once for 1 dose. Before and after period, monthly for treatment    Dispense:  13 tablet    Refill:  1    Order Specific Question:   Supervising Provider    Answer:   Gae Dry [id number]             GYN counsel adequate intake of calcium and vitamin D, diet and exercise     F/U  Return in about 1 year (around 07/28/2022).  Elyanah Farino B. Saveah Bahar, PA-C 07/28/2021 11:35 AM

## 2021-07-28 ENCOUNTER — Other Ambulatory Visit: Payer: Self-pay

## 2021-07-28 ENCOUNTER — Ambulatory Visit (INDEPENDENT_AMBULATORY_CARE_PROVIDER_SITE_OTHER): Payer: BC Managed Care – PPO | Admitting: Obstetrics and Gynecology

## 2021-07-28 ENCOUNTER — Encounter: Payer: Self-pay | Admitting: Obstetrics and Gynecology

## 2021-07-28 VITALS — BP 120/70 | Ht 59.0 in | Wt 146.0 lb

## 2021-07-28 DIAGNOSIS — B3731 Acute candidiasis of vulva and vagina: Secondary | ICD-10-CM | POA: Diagnosis not present

## 2021-07-28 DIAGNOSIS — Z01419 Encounter for gynecological examination (general) (routine) without abnormal findings: Secondary | ICD-10-CM | POA: Diagnosis not present

## 2021-07-28 MED ORDER — FLUCONAZOLE 150 MG PO TABS: 150.0000 mg | ORAL_TABLET | Freq: Once | ORAL | 1 refills | Status: AC

## 2021-07-28 NOTE — Patient Instructions (Signed)
I value your feedback and you entrusting us with your care. If you get a Krista Daniels patient survey, I would appreciate you taking the time to let us know about your experience today. Thank you! ? ? ?

## 2021-07-31 IMAGING — CT CT HEAD W/O CM
3 series · 15 of 45 positions shown, 18 images · non-contrast
Comparison: Head CT 02/11/2013

CLINICAL DATA: Head trauma a few days ago. Persistent headache.

EXAM:
CT HEAD WITHOUT CONTRAST
TECHNIQUE: Contiguous axial images were obtained from the base of the skull
through the vertex without intravenous contrast.

[Series 3: head wo · axial · 0.41mm/px · z∈[-147,-32]mm · 9 of 28 slices shown, 12 images]
[im 3/28  brain]
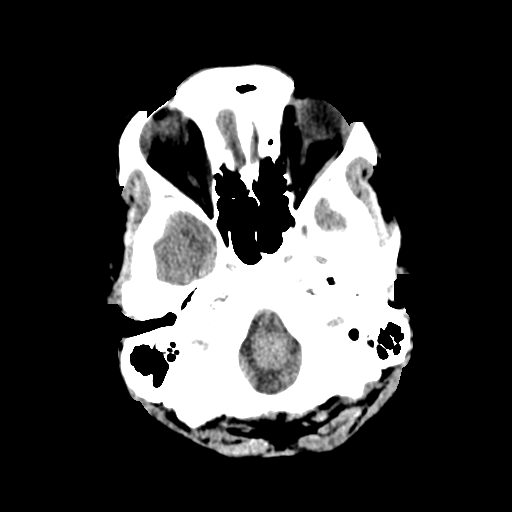
[im 3/28  bone]
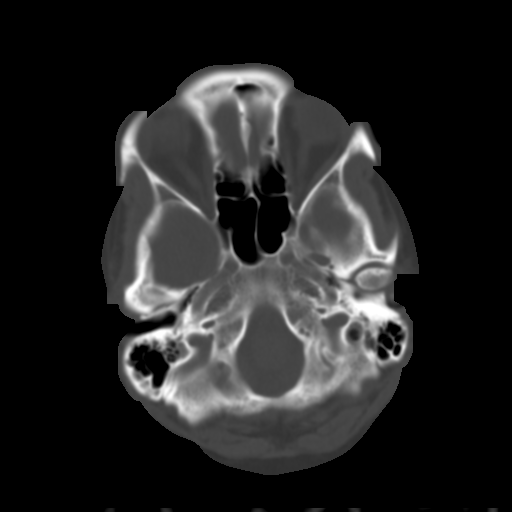
[im 6/28  brain]
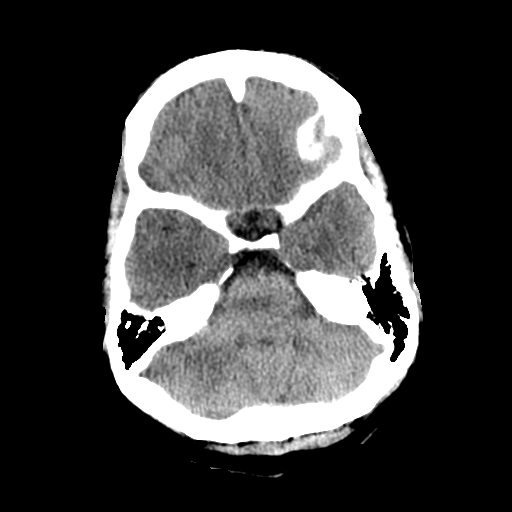
[im 9/28  brain]
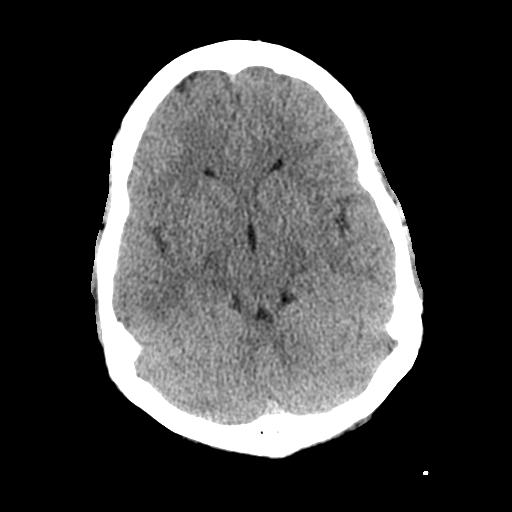
[im 12/28  brain]
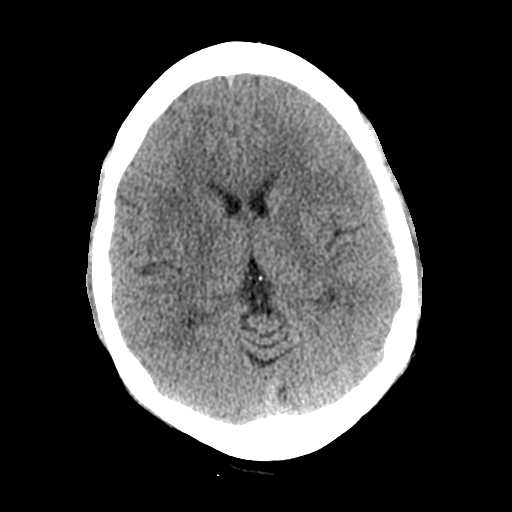
[im 15/28  brain]
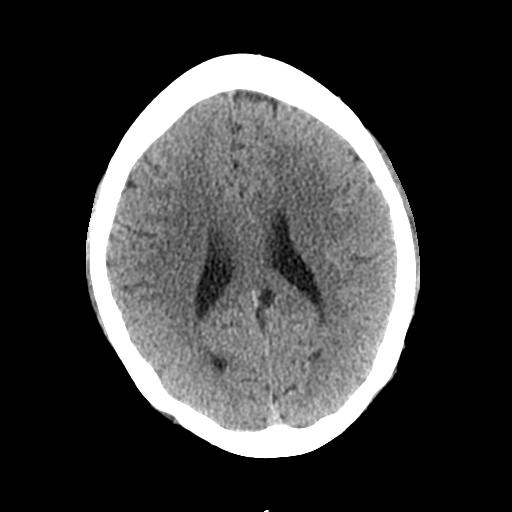
[im 15/28  bone]
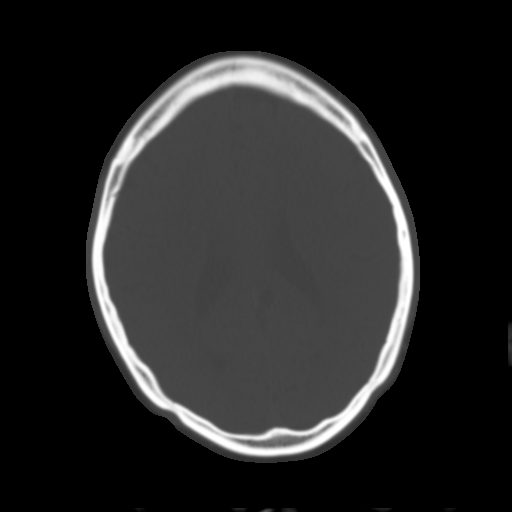
[im 17/28  brain]
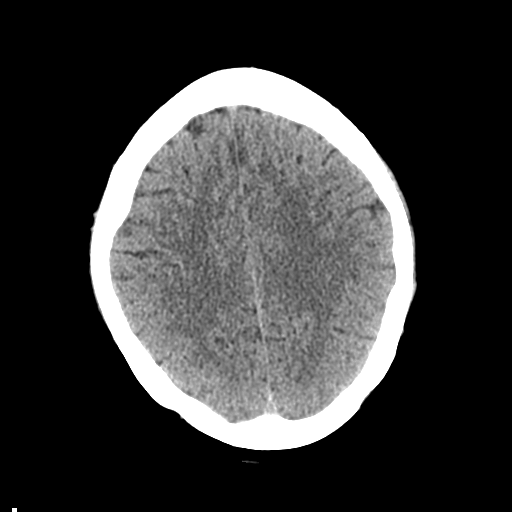
[im 20/28  brain]
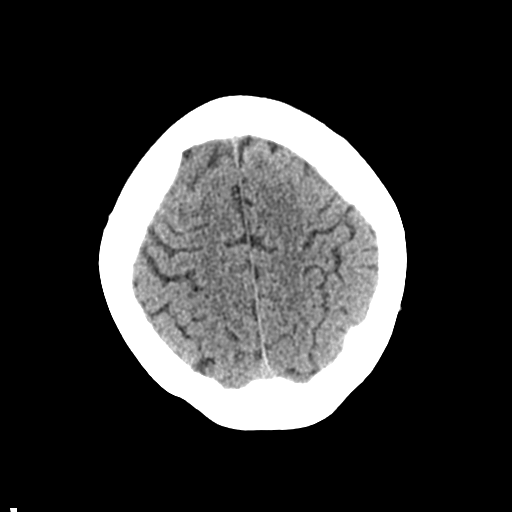
[im 23/28  brain]
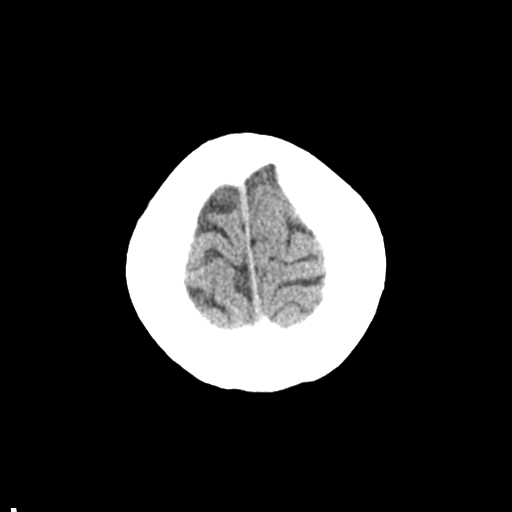
[im 26/28  brain]
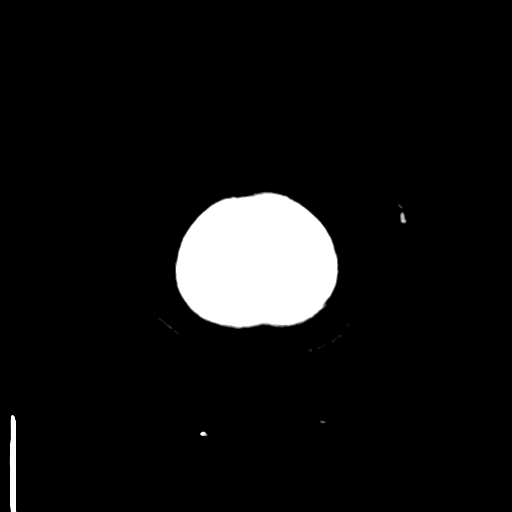
[im 26/28  bone]
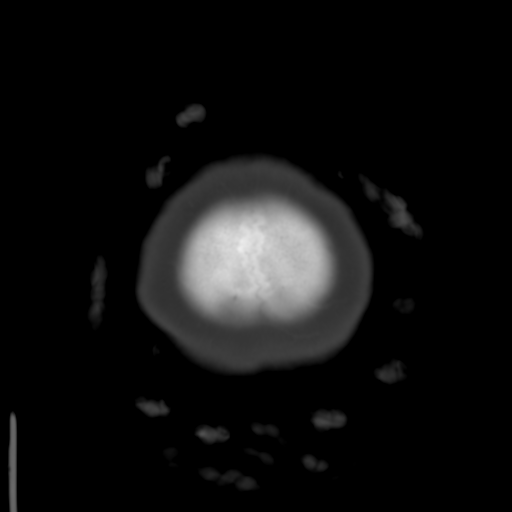

[Series 4: coronal soft tissue · coronal · 0.28mm/px · 3 of 62 slices shown]
[im 21/62  brain]
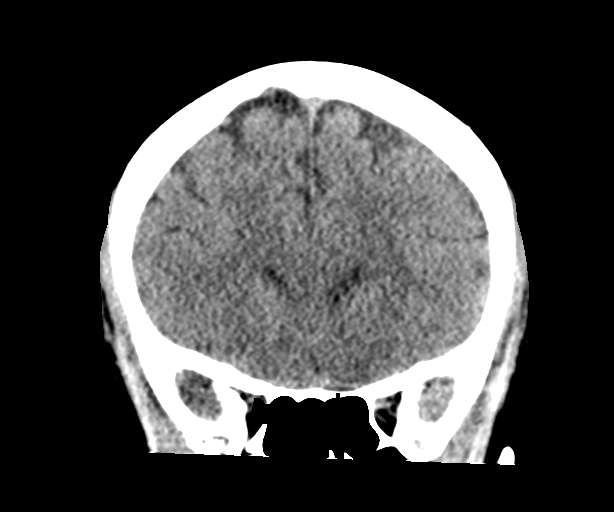
[im 28/62  brain]
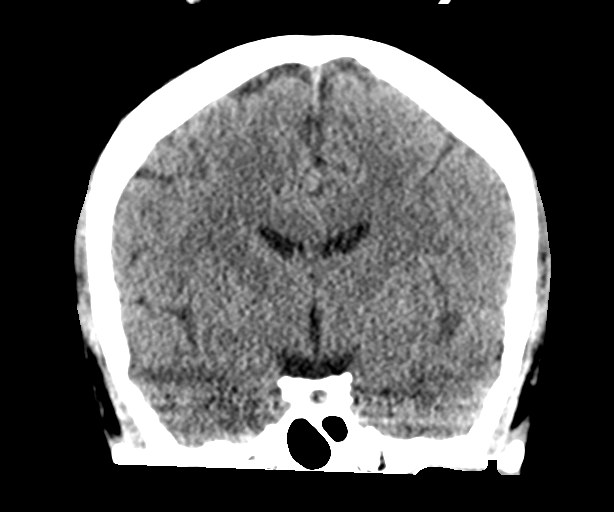
[im 34/62  brain]
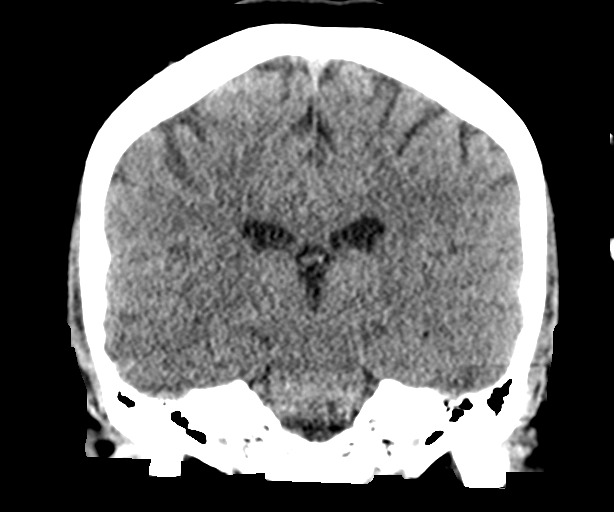

[Series 5: sagittal soft tissue · sagittal · 0.28mm/px · 3 of 53 slices shown]
[im 18/53  brain]
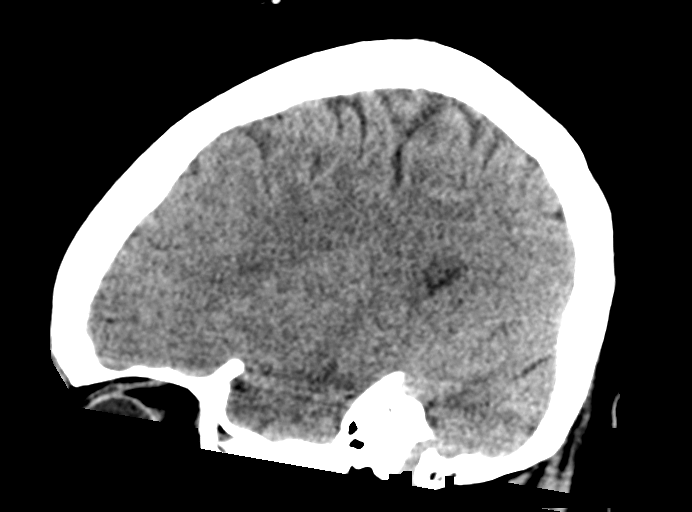
[im 27/53  brain]
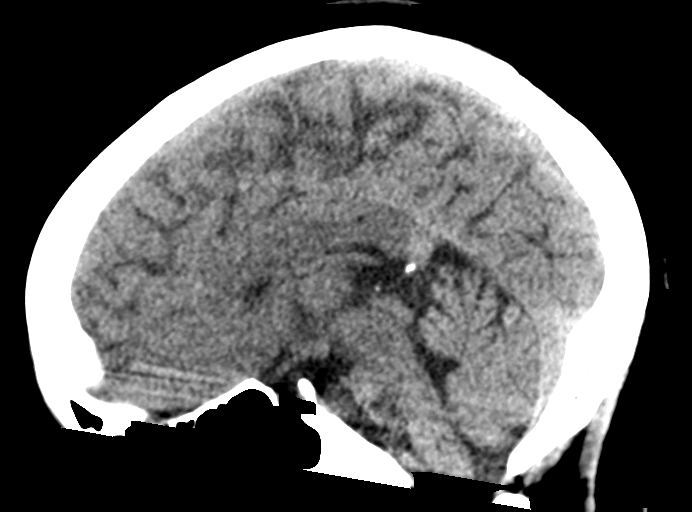
[im 35/53  brain]
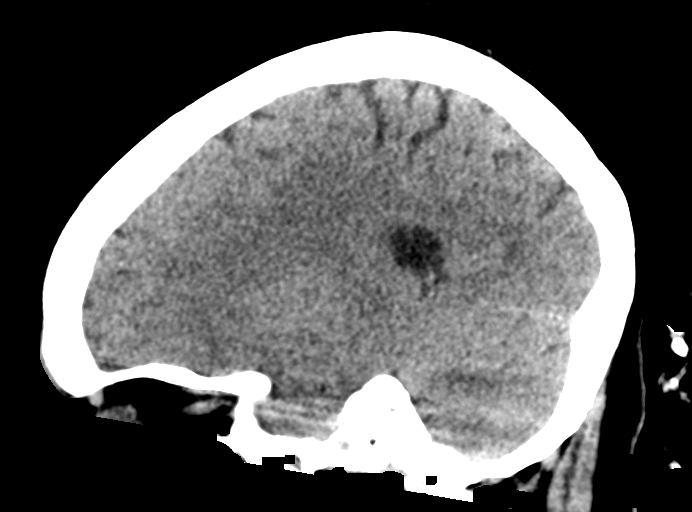

[15 of 45 positions shown; findings below may reference images not displayed]

FINDINGS: Brain: The ventricles are normal in size and configuration. No
extra-axial fluid collections are identified. The gray-white
differentiation is maintained. No CT findings for acute hemispheric
infarction or intracranial hemorrhage. No mass lesions. The
brainstem and cerebellum are normal.

Vascular: No hyperdense vessels or obvious aneurysm.

Skull: No acute skull fracture. No bone lesion.

Sinuses/Orbits: The paranasal sinuses and mastoid air cells are
clear. The globes are intact.

Other: No scalp lesions, laceration or hematoma.
IMPRESSION: Normal head CT.

## 2021-12-16 ENCOUNTER — Ambulatory Visit: Payer: Self-pay

## 2021-12-16 NOTE — Telephone Encounter (Signed)
Summary: Med question   Pt called seeking medical advice regarding phentermine, she is currently taking wellbutrin and wants to make sure she is not going to experience med interference. She called [facility] but her call dropped   Best contact: [phone]     Called pt's pharmacy and spoke with Ephriam Knuckles (pharmacist) who stated that the interaction can cause increased concentration of phentermine. Called pt and advised her what the pharmacist stated. Pt has a new pt appt in October. Advised to call her prescriber to see if willing to call in prescription in the interim. Reason for Disposition  [1] Caller has NON-URGENT medicine question about med that PCP prescribed AND [2] triager unable to answer question  Answer Assessment - Initial Assessment Questions 1. NAME of MEDICATION: "What medicine are you calling about?"     Phentermine and wellbutrin 2. QUESTION: "What is your question?" (e.g., double dose of medicine, side effect)     Pt checking to see if there is any interaction between the 2 drugs above 3. PRESCRIBING HCP: "Who prescribed it?" Reason: if prescribed by specialist, call should be referred to that group.     OBGYN 4. SYMPTOMS: "Do you have any symptoms?"     N/a 5. SEVERITY: If symptoms are present, ask "Are they mild, moderate or severe?"     N/a 6. PREGNANCY:  "Is there any chance that you are pregnant?" "When was your last menstrual period?"     N/a  Protocols used: Medication Question Call-A-AH

## 2021-12-20 ENCOUNTER — Ambulatory Visit: Payer: BC Managed Care – PPO | Admitting: Critical Care Medicine

## 2022-04-06 ENCOUNTER — Ambulatory Visit: Payer: BC Managed Care – PPO | Admitting: Family Medicine

## 2022-09-09 ENCOUNTER — Emergency Department
Admission: EM | Admit: 2022-09-09 | Discharge: 2022-09-10 | Disposition: A | Discharge status: Home / Self Care | Payer: BC Managed Care – PPO | Attending: Emergency Medicine | Admitting: Emergency Medicine

## 2022-09-09 ENCOUNTER — Other Ambulatory Visit: Payer: Self-pay

## 2022-09-09 DIAGNOSIS — M5432 Sciatica, left side: Secondary | ICD-10-CM

## 2022-09-09 DIAGNOSIS — R8289 Other abnormal findings on cytological and histological examination of urine: Secondary | ICD-10-CM | POA: Insufficient documentation

## 2022-09-09 DIAGNOSIS — R519 Headache, unspecified: Secondary | ICD-10-CM | POA: Diagnosis present

## 2022-09-09 DIAGNOSIS — R202 Paresthesia of skin: Secondary | ICD-10-CM | POA: Insufficient documentation

## 2022-09-09 MED ORDER — LIDOCAINE 5 % EX PTCH
1.0000 | MEDICATED_PATCH | CUTANEOUS | Status: DC
  Administered 2022-09-10: 1 via TRANSDERMAL
  Filled 2022-09-09: qty 1

## 2022-09-09 MED ORDER — DIPHENHYDRAMINE HCL 50 MG/ML IJ SOLN
12.5000 mg | Freq: Once | INTRAMUSCULAR | Status: AC
  Administered 2022-09-10: 12.5 mg via INTRAVENOUS
  Filled 2022-09-09: qty 1

## 2022-09-09 MED ORDER — METOCLOPRAMIDE HCL 5 MG/ML IJ SOLN
10.0000 mg | Freq: Once | INTRAMUSCULAR | Status: AC
  Administered 2022-09-10: 10 mg via INTRAVENOUS
  Filled 2022-09-09: qty 2

## 2022-09-09 NOTE — ED Provider Notes (Signed)
   [facility] Provider Note    Event Date/Time   First MD Initiated Contact with Patient 09/09/22 2336     (approximate)   History   No chief complaint on file.   HPI  Krista Daniels is a 37 y.o. female  ***       Physical Exam   Triage Vital Signs: ED Triage Vitals  Enc Vitals Group     BP      Pulse      Resp      Temp      Temp src      SpO2      Weight      Height      Head Circumference      Peak Flow      Pain Score      Pain Loc      Pain Edu?      Excl. in [place]?     Most recent vital signs: There were no vitals filed for this visit.   General: Awake, no distress.  CV:  Good peripheral perfusion.  Resp:  Normal effort.  Abd:  No distention.  Other:     ED Results / Procedures / Treatments   Labs (all labs ordered are listed, but only abnormal results are displayed) Labs Reviewed - No data to display   EKG  My interpretation of EKG:    RADIOLOGY I have reviewed the xray personally and agree with radiology read   PROCEDURES:  Critical Care performed: {CriticalCareYesNo:19197::"Yes, see critical care procedure note(s)","No"}  Procedures   MEDICATIONS ORDERED IN ED: Medications - No data to display   IMPRESSION / MDM / Cameron / ED COURSE  I reviewed the triage vital signs and the nursing notes.   Patient's presentation is most consistent with {EM COPA:27473}     The patient is on the cardiac monitor to evaluate for evidence of arrhythmia and/or significant heart rate changes.      FINAL CLINICAL IMPRESSION(S) / ED DIAGNOSES   Final diagnoses:  None     Rx / DC Orders   ED Discharge Orders     None        Note:  This document was prepared using Dragon voice recognition software and may include unintentional dictation errors.
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
## 2022-09-10 ENCOUNTER — Emergency Department: Payer: BC Managed Care – PPO

## 2022-09-10 LAB — CBC WITH DIFFERENTIAL/PLATELET
Abs Immature Granulocytes: 0.02 10*3/uL (ref 0.00–0.07)
Basophils Absolute: 0 10*3/uL (ref 0.0–0.1)
Basophils Relative: 0 %
Eosinophils Absolute: 0.1 10*3/uL (ref 0.0–0.5)
Eosinophils Relative: 1 %
HCT: 40.6 % (ref 36.0–46.0)
Hemoglobin: 13.2 g/dL (ref 12.0–15.0)
Immature Granulocytes: 0 %
Lymphocytes Relative: 28 %
Lymphs Abs: 2 10*3/uL (ref 0.7–4.0)
MCH: 28.7 pg (ref 26.0–34.0)
MCHC: 32.5 g/dL (ref 30.0–36.0)
MCV: 88.3 fL (ref 80.0–100.0)
Monocytes Absolute: 0.5 10*3/uL (ref 0.1–1.0)
Monocytes Relative: 7 %
Neutro Abs: 4.4 10*3/uL (ref 1.7–7.7)
Neutrophils Relative %: 64 %
Platelets: 303 10*3/uL (ref 150–400)
RBC: 4.6 MIL/uL (ref 3.87–5.11)
RDW: 13.3 % (ref 11.5–15.5)
WBC: 7 10*3/uL (ref 4.0–10.5)
nRBC: 0 % (ref 0.0–0.2)

## 2022-09-10 LAB — URINALYSIS, ROUTINE W REFLEX MICROSCOPIC
Bacteria, UA: NONE SEEN
Bilirubin Urine: NEGATIVE
Glucose, UA: NEGATIVE mg/dL
Ketones, ur: NEGATIVE mg/dL
Leukocytes,Ua: NEGATIVE
Nitrite: NEGATIVE
Protein, ur: NEGATIVE mg/dL
Specific Gravity, Urine: 1.013 (ref 1.005–1.030)
pH: 7 (ref 5.0–8.0)

## 2022-09-10 LAB — COMPREHENSIVE METABOLIC PANEL
ALT: 8 U/L (ref 0–44)
AST: 14 U/L — ABNORMAL LOW (ref 15–41)
Albumin: 4 g/dL (ref 3.5–5.0)
Alkaline Phosphatase: 59 U/L (ref 38–126)
Anion gap: 7 (ref 5–15)
BUN: 10 mg/dL (ref 6–20)
CO2: 24 mmol/L (ref 22–32)
Calcium: 9.6 mg/dL (ref 8.9–10.3)
Chloride: 106 mmol/L (ref 98–111)
Creatinine, Ser: 0.88 mg/dL (ref 0.44–1.00)
GFR, Estimated: >60 mL/min (ref >60)
Glucose, Bld: 104 mg/dL — ABNORMAL HIGH (ref 70–99)
Potassium: 3.7 mmol/L (ref 3.5–5.1)
Sodium: 137 mmol/L (ref 135–145)
Total Bilirubin: 0.7 mg/dL (ref 0.3–1.2)
Total Protein: 7.3 g/dL (ref 6.5–8.1)

## 2022-09-10 LAB — POC URINE PREG, ED: Preg Test, Ur: NEGATIVE

## 2022-09-10 LAB — MAGNESIUM: Magnesium: 2.2 mg/dL (ref 1.7–2.4)

## 2022-09-10 LAB — LITHIUM LEVEL: Lithium Lvl: 0.93 mmol/L (ref 0.60–1.20)

## 2022-09-10 MED ORDER — LIDOCAINE 5 % EX PTCH: 1.0000 | MEDICATED_PATCH | Freq: Two times a day (BID) | CUTANEOUS | 0 refills | Status: AC

## 2022-09-10 NOTE — Discharge Instructions (Addendum)
Your workup was reassuring with normal CT imaging without any evidence of any fractures.  Try the lidocaine patches to use during the daytime and follow-up with [facility] if you continue to have the sciatica-like pain.  Return to the ER if you develop numbness in the entire leg, weakness in the entire leg, urinating or defecating on yourself worsening headaches or any other concerns.  Follow up mychart for lithium level.

## 2022-11-23 ENCOUNTER — Encounter: Payer: Self-pay | Admitting: Obstetrics and Gynecology

## 2022-11-23 NOTE — Telephone Encounter (Signed)
The patient was contacted by phone from [facility] and scheduled

## 2022-12-06 ENCOUNTER — Emergency Department
Admission: EM | Admit: 2022-12-06 | Discharge: 2022-12-06 | Disposition: A | Discharge status: Home / Self Care | Payer: BC Managed Care – PPO | Attending: Emergency Medicine | Admitting: Emergency Medicine

## 2022-12-06 ENCOUNTER — Emergency Department: Payer: BC Managed Care – PPO

## 2022-12-06 ENCOUNTER — Other Ambulatory Visit: Payer: Self-pay

## 2022-12-06 DIAGNOSIS — W228XXA Striking against or struck by other objects, initial encounter: Secondary | ICD-10-CM | POA: Diagnosis not present

## 2022-12-06 DIAGNOSIS — S0990XA Unspecified injury of head, initial encounter: Secondary | ICD-10-CM | POA: Diagnosis present

## 2022-12-06 DIAGNOSIS — S060X0A Concussion without loss of consciousness, initial encounter: Secondary | ICD-10-CM | POA: Insufficient documentation

## 2022-12-06 MED ORDER — MELOXICAM 15 MG PO TABS: 15.0000 mg | ORAL_TABLET | Freq: Every day | ORAL | 0 refills | Status: AC

## 2022-12-06 NOTE — ED Triage Notes (Signed)
Pt to ED for head injury last night, states cabinet fell and hit left side of head and left shoulder.  Denies LOC.  +nausea.  Alert and oriented, ambulatory, NAD noted.

## 2022-12-06 NOTE — ED Provider Notes (Signed)
[facility] Provider Note    Event Date/Time   First MD Initiated Contact with Patient 12/06/22 1708     (approximate)   History   No chief complaint on file.   HPI  Krista Daniels is a 37 y.o. female with PMH of postconcussive syndrome who presents to the ED for evaluation of a head injury that occurred last night.  Patient states that a cabinet full of dishes fell off the wall hitting her on the left side of her head and left shoulder.  She denies LOC.  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  She also reports that she called her mom last night and was confused but does not remember this.  Since the injury she has had head pain, shoulder pain and nausea.  Patient has tried to treat her symptoms at home taking Tylenol and ibuprofen every 4-6 hours.      Physical Exam   Triage Vital Signs: ED Triage Vitals [12/06/22 1536]  Enc Vitals Group     BP (!) 132/91     Pulse Rate 84     Resp 18     Temp 98.2 F (36.8 C)     Temp src      SpO2 100 %     Weight 142 lb (64.4 kg)     Height 4\' 11"  (1.499 m)     Head Circumference      Peak Flow      Pain Score 5     Pain Loc      Pain Edu?      Excl. in [place]?     Most recent vital signs: Vitals:   12/06/22 1536  BP: (!) 132/91  Pulse: 84  Resp: 18  Temp: 98.2 F (36.8 C)  SpO2: 100%     General: Awake, no distress.  CV:  Good peripheral perfusion.  Resp:  Normal effort.  Abd:  No distention.  Other:  No visible swelling, or lacerations to the head face neck or shoulder.  No focal deficits on neuroexam.  PERRL.   ED Results / Procedures / Treatments   Labs (all labs ordered are listed, but only abnormal results are displayed) Labs Reviewed - No data to display   RADIOLOGY  CT of the head obtained in the ED today.  I interpreted the images as well as reviewed the radiologist report.    PROCEDURES:  Critical Care performed:  No  Procedures   MEDICATIONS ORDERED IN ED: Medications - No data to display   IMPRESSION / MDM / ASSESSMENT AND PLAN / ED COURSE  I reviewed the triage vital signs and the nursing notes.                              Differential diagnosis includes, but is not limited to, concussion, intracranial bleed, contusion, laceration, headache.  Patient's presentation is most consistent with acute complicated illness / injury requiring diagnostic workup.  Patient presented to the ED for evaluation of a head injury.  Based on patient's abnormal behavior and Nexus criteria head CT was obtained.  I interpreted the images as well as reviewed the radiologist report which was negative for any acute intracranial pathology.   I believe patient's presentation is most consistent with a concussion.  I advised patient to continue taking Tylenol and I will send in a prescription for meloxicam  so she does not need to continue taking ibuprofen around-the-clock.  I discussed with the patient taking it easy at work and listening to her body.  If she has an increase in symptoms she needs to rest.  Patient is a school principal so we discussed limiting screen time.  Patient voiced understanding, all questions were answered and patient was stable at discharge.     FINAL CLINICAL IMPRESSION(S) / ED DIAGNOSES   Final diagnoses:  Concussion without loss of consciousness, initial encounter     Rx / DC Orders   ED Discharge Orders          Ordered    meloxicam (MOBIC) 15 MG tablet  Daily        12/06/22 1808             Note:  This document was prepared using Dragon voice recognition software and may include unintentional dictation errors.   Cameron Ali, PA-C 12/06/22 1809    Georga Hacking, MD 12/06/22 [phone]

## 2022-12-06 NOTE — Discharge Instructions (Addendum)
If you continue to have symptoms after 2 weeks please follow-up with your PCP.

## 2023-01-09 ENCOUNTER — Encounter: Payer: Self-pay | Admitting: Emergency Medicine

## 2023-01-09 ENCOUNTER — Other Ambulatory Visit: Payer: Self-pay

## 2023-01-09 ENCOUNTER — Emergency Department: Payer: BC Managed Care – PPO

## 2023-01-09 ENCOUNTER — Emergency Department
Admission: EM | Admit: 2023-01-09 | Discharge: 2023-01-09 | Disposition: A | Discharge status: Home / Self Care | Payer: BC Managed Care – PPO | Source: Home / Self Care | Attending: Emergency Medicine | Admitting: Emergency Medicine

## 2023-01-09 DIAGNOSIS — I10 Essential (primary) hypertension: Secondary | ICD-10-CM | POA: Diagnosis not present

## 2023-01-09 DIAGNOSIS — R519 Headache, unspecified: Secondary | ICD-10-CM | POA: Insufficient documentation

## 2023-01-09 DIAGNOSIS — R200 Anesthesia of skin: Secondary | ICD-10-CM | POA: Diagnosis not present

## 2023-01-09 DIAGNOSIS — H539 Unspecified visual disturbance: Secondary | ICD-10-CM | POA: Diagnosis not present

## 2023-01-09 LAB — CBC WITH DIFFERENTIAL/PLATELET
Abs Immature Granulocytes: 0.02 10*3/uL (ref 0.00–0.07)
Basophils Absolute: 0 10*3/uL (ref 0.0–0.1)
Basophils Relative: 1 %
Eosinophils Absolute: 0.1 10*3/uL (ref 0.0–0.5)
Eosinophils Relative: 1 %
HCT: 42.2 % (ref 36.0–46.0)
Hemoglobin: 13.9 g/dL (ref 12.0–15.0)
Immature Granulocytes: 0 %
Lymphocytes Relative: 24 %
Lymphs Abs: 1.7 10*3/uL (ref 0.7–4.0)
MCH: 29 pg (ref 26.0–34.0)
MCHC: 32.9 g/dL (ref 30.0–36.0)
MCV: 88.1 fL (ref 80.0–100.0)
Monocytes Absolute: 0.4 10*3/uL (ref 0.1–1.0)
Monocytes Relative: 6 %
Neutro Abs: 5 10*3/uL (ref 1.7–7.7)
Neutrophils Relative %: 68 %
Platelets: 318 10*3/uL (ref 150–400)
RBC: 4.79 MIL/uL (ref 3.87–5.11)
RDW: 12.4 % (ref 11.5–15.5)
WBC: 7.3 10*3/uL (ref 4.0–10.5)
nRBC: 0 % (ref 0.0–0.2)

## 2023-01-09 LAB — COMPREHENSIVE METABOLIC PANEL
ALT: 7 U/L (ref 0–44)
AST: 14 U/L — ABNORMAL LOW (ref 15–41)
Albumin: 4.3 g/dL (ref 3.5–5.0)
Alkaline Phosphatase: 65 U/L (ref 38–126)
Anion gap: 8 (ref 5–15)
BUN: 12 mg/dL (ref 6–20)
CO2: 27 mmol/L (ref 22–32)
Calcium: 10.1 mg/dL (ref 8.9–10.3)
Chloride: 103 mmol/L (ref 98–111)
Creatinine, Ser: 0.89 mg/dL (ref 0.44–1.00)
GFR, Estimated: >60 mL/min (ref >60)
Glucose, Bld: 109 mg/dL — ABNORMAL HIGH (ref 70–99)
Potassium: 3.9 mmol/L (ref 3.5–5.1)
Sodium: 138 mmol/L (ref 135–145)
Total Bilirubin: 0.5 mg/dL (ref 0.3–1.2)
Total Protein: 7.5 g/dL (ref 6.5–8.1)

## 2023-01-09 LAB — LIPASE, BLOOD: Lipase: 33 U/L (ref 11–51)

## 2023-01-09 LAB — LITHIUM LEVEL: Lithium Lvl: 0.43 mmol/L — ABNORMAL LOW (ref 0.60–1.20)

## 2023-01-09 MED ORDER — METOCLOPRAMIDE HCL 5 MG/ML IJ SOLN
10.0000 mg | Freq: Once | INTRAMUSCULAR | Status: AC
  Administered 2023-01-09: 10 mg via INTRAVENOUS
  Filled 2023-01-09: qty 2

## 2023-01-09 MED ORDER — LORAZEPAM 0.5 MG PO TABS
0.5000 mg | ORAL_TABLET | Freq: Once | ORAL | Status: AC
  Administered 2023-01-09: 0.5 mg via ORAL
  Filled 2023-01-09: qty 1

## 2023-01-09 MED ORDER — KETOROLAC TROMETHAMINE 15 MG/ML IJ SOLN
15.0000 mg | Freq: Once | INTRAMUSCULAR | Status: AC
  Administered 2023-01-09: 15 mg via INTRAVENOUS
  Filled 2023-01-09: qty 1

## 2023-01-09 MED ORDER — DIPHENHYDRAMINE HCL 50 MG/ML IJ SOLN
50.0000 mg | Freq: Once | INTRAMUSCULAR | Status: AC
  Administered 2023-01-09: 50 mg via INTRAVENOUS
  Filled 2023-01-09: qty 1

## 2023-01-09 NOTE — ED Notes (Signed)
Patient transported to MRI 

## 2023-01-09 NOTE — ED Triage Notes (Signed)
Pt to ED via POV. Pt states that she is currently being treated for post concussion syndrome. Pt states that she has been having headaches but today she has started having "tightness" in her head. Pt states that it felt like the tightness was moving. Pt states that this has never happened before. Pt states that she is also having tingling in her fingers and that her tongue feels weird. Pt states that symptoms started around 1300.  Pt states that she is currently taking Mobic and Nortriptyline for her post concussion syndrome.
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
## 2023-01-16 ENCOUNTER — Ambulatory Visit (INDEPENDENT_AMBULATORY_CARE_PROVIDER_SITE_OTHER): Payer: BC Managed Care – PPO | Admitting: Obstetrics and Gynecology

## 2023-01-16 ENCOUNTER — Encounter: Payer: Self-pay | Admitting: Obstetrics and Gynecology

## 2023-01-16 ENCOUNTER — Other Ambulatory Visit (HOSPITAL_COMMUNITY)
Admission: RE | Admit: 2023-01-16 | Discharge: 2023-01-16 | Disposition: A | Discharge status: Home / Self Care | Payer: BC Managed Care – PPO | Source: Ambulatory Visit | Attending: Obstetrics and Gynecology | Admitting: Obstetrics and Gynecology

## 2023-01-16 VITALS — BP 110/70 | HR 71 | Ht 59.0 in | Wt 144.1 lb

## 2023-01-16 DIAGNOSIS — A599 Trichomoniasis, unspecified: Secondary | ICD-10-CM

## 2023-01-16 DIAGNOSIS — Z01419 Encounter for gynecological examination (general) (routine) without abnormal findings: Secondary | ICD-10-CM

## 2023-01-16 DIAGNOSIS — Z1151 Encounter for screening for human papillomavirus (HPV): Secondary | ICD-10-CM | POA: Insufficient documentation

## 2023-01-16 DIAGNOSIS — Z124 Encounter for screening for malignant neoplasm of cervix: Secondary | ICD-10-CM

## 2023-01-16 DIAGNOSIS — B3731 Acute candidiasis of vulva and vagina: Secondary | ICD-10-CM

## 2023-01-16 MED ORDER — FLUCONAZOLE 150 MG PO TABS: 150.0000 mg | ORAL_TABLET | Freq: Once | ORAL | 2 refills | Status: AC

## 2023-01-16 NOTE — Patient Instructions (Signed)
I value your feedback and you entrusting us with your care. If you get a [facility] patient survey, I would appreciate you taking the time to let us know about your experience today. Thank you! ? ? ?

## 2023-01-16 NOTE — Progress Notes (Signed)
PCP:  [facility]   Chief Complaint  Patient presents with   Annual Exam     HPI:      [name] is a 37 y.o. [id number] whose LMP was Patient's last menstrual period was 12/29/2022 (exact date)., presents today for her annual examination.  Her menses are monthly, lasting 5-7 days, mod flow, no BTB, mild dysmen/mood changes with periods.   Sex activity: single partner, contraception - none. Declines BC. No pain/bleeding. Pt aware pregnancy not recommended with some of her meds.  Last Pap: 06/27/18  Results were: no abnormalities /neg HPV DNA   Hx of vag d/c with itchiness, no odor week before menses, treats with diflucan before and after menses with sx control; pt needs Rx RF. Sx no longer monthly but still frequently through year. Not taking probiotics. No sx today. Hx of BV and yeast in past.   There is a  FH of breast cancer in her MGM and MGGM, genetic testing not indicated. There is no FH of ovarian cancer. The patient does do self-breast exams.  Tobacco use: The patient denies current or previous tobacco use. Alcohol use: social drinker No drug use.  Exercise: moderately active  She does get adequate calcium and Vitamin D in her diet. Hx of Vit D deficiency with Vit D Rx with PCP in past.    Patient Active Problem List   Diagnosis Date Noted   OSA (obstructive sleep apnea) 04/24/2021   Post concussion syndrome 05/10/2020   Acne 04/16/2020   Fibromyalgia 04/16/2020   Cardiac murmur 12/30/2019   Abnormal posture 06/04/2019   Bursitis of shoulder 06/04/2019   Edema 06/04/2019   Muscle weakness 06/04/2019   Shoulder joint pain 06/04/2019   Sprain of acromioclavicular ligament 06/04/2019   Anxiety state 08/10/2017   Other insomnia 08/10/2017   Left foot pain 11/04/2015   Left arm pain 03/21/2013   Bee sting allergy 10/07/2012    Past Surgical History:  Procedure Laterality Date   CESAREAN SECTION  2008, 2010    Family History  Problem  Relation Age of Onset   Healthy Mother    Heart attack Father    Breast cancer Maternal Grandmother 24   Diabetes Maternal Grandmother    Colon cancer Maternal Grandfather 77   Breast cancer Maternal Great-grandmother        early years    Social History   Socioeconomic History   Marital status: Married    Spouse name: Not on file   Number of children: Not on file   Years of education: Not on file   Highest education level: Not on file  Occupational History   Not on file  Tobacco Use   Smoking status: Never   Smokeless tobacco: Never  Vaping Use   Vaping status: Never Used  Substance and Sexual Activity   Alcohol use: Yes    Comment: socially   Drug use: No   Sexual activity: Yes    Birth control/protection: None  Other Topics Concern   Not on file  Social History Narrative   Not on file   Social Determinants of Health   Financial Resource Strain: Low Risk  (07/05/2022)   Received from [facility], [facility]   Overall Financial Resource Strain (CARDIA)    Difficulty of Paying Living Expenses: Not hard at all  Food Insecurity: No Food Insecurity (07/05/2022)   Received from [facility], [facility]  Hunger Vital Sign    Worried About Running Out of Food in the Last Year: Never true    Ran Out of Food in the Last Year: Never true  Transportation Needs: No Transportation Needs (07/05/2022)   Received from [facility], [facility]   [facility] - Transportation    In the past 12 months, has lack of transportation kept you from medical appointments or from getting medications?: No    Lack of Transportation (Non-Medical): No  Physical Activity: Sufficiently Active (06/27/2018)   Exercise Vital Sign    Days of Exercise per Week: 5 days    Minutes of Exercise per Session: 50 min  Stress: Not on file  Social Connections: Not on file  Intimate Partner Violence: Not on  file     Current Outpatient Medications:    ARIPiprazole (ABILIFY) 5 MG tablet, Take 5 mg by mouth daily., Disp: , Rfl:    BELSOMRA 20 MG TABS, Take 1 tablet by mouth at bedtime as needed., Disp: , Rfl:    buPROPion (WELLBUTRIN SR) 150 MG 12 hr tablet, Take 150 mg by mouth 2 (two) times daily., Disp: , Rfl:    EPINEPHrine 0.3 mg/0.3 mL IJ SOAJ injection, , Disp: , Rfl:    fluconazole (DIFLUCAN) 150 MG tablet, Take 1 tablet (150 mg total) by mouth once for 1 dose. May repeat in 3 days if still having symptoms, Disp: 2 tablet, Rfl: 2   fluticasone (FLONASE) 50 MCG/ACT nasal spray, Place 2 sprays into both nostrils daily., Disp: , Rfl:    lithium carbonate (ESKALITH) 450 MG CR tablet, Take 450 mg by mouth daily., Disp: , Rfl:    LORazepam (ATIVAN) 1 MG tablet, Take 1 mg by mouth 2 (two) times daily., Disp: , Rfl:    methylphenidate (RITALIN LA) 20 MG 24 hr capsule, Take 20 mg by mouth 2 (two) times daily., Disp: , Rfl:    nortriptyline (PAMELOR) 10 MG capsule, Take by mouth., Disp: , Rfl:    QUVIVIQ 50 MG TABS, Take 1 tablet by mouth daily., Disp: , Rfl:    SUMAtriptan (IMITREX) 25 MG tablet, Take by mouth., Disp: , Rfl:    Vitamin D, Ergocalciferol, (DRISDOL) 1.25 MG (50000 UNIT) CAPS capsule, Take 50,000 Units by mouth once a week., Disp: , Rfl:    albuterol (VENTOLIN HFA) 108 (90 Base) MCG/ACT inhaler, Inhale into the lungs., Disp: , Rfl:      ROS:  Review of Systems  Constitutional:  Negative for fatigue, fever and unexpected weight change.  Respiratory:  Negative for cough, shortness of breath and wheezing.   Cardiovascular:  Negative for chest pain, palpitations and leg swelling.  Gastrointestinal:  Negative for blood in stool, constipation, diarrhea, nausea and vomiting.  Endocrine: Negative for cold intolerance, heat intolerance and polyuria.  Genitourinary:  Negative for dyspareunia, dysuria, flank pain, frequency, genital sores, hematuria, menstrual problem, pelvic pain,  urgency, vaginal bleeding, vaginal discharge and vaginal pain.  Musculoskeletal:  Negative for back pain, joint swelling and myalgias.  Skin:  Negative for rash.  Neurological:  Negative for dizziness, syncope, light-headedness, numbness and headaches.  Hematological:  Negative for adenopathy.  Psychiatric/Behavioral:  Negative for agitation, confusion, sleep disturbance and suicidal ideas. The patient is not nervous/anxious.    BREAST: No symptoms   Objective: BP 110/70   Pulse 71   Ht 4\' 11"  (1.499 m)   Wt 144 lb 1.6 oz (65.4 kg)   LMP 12/29/2022 (Exact Date)   BMI 29.10  kg/m    Physical Exam Constitutional:      Appearance: She is well-developed.  Genitourinary:     Vulva normal.     Right Labia: No rash, tenderness or lesions.    Left Labia: No tenderness, lesions or rash.    No vaginal discharge, erythema or tenderness.      Right Adnexa: not tender and no mass present.    Left Adnexa: not tender and no mass present.    No cervical friability or polyp.     Uterus is not enlarged or tender.  Breasts:    Right: No mass, nipple discharge, skin change or tenderness.     Left: No mass, nipple discharge, skin change or tenderness.  Neck:     Thyroid: No thyromegaly.  Cardiovascular:     Rate and Rhythm: Normal rate and regular rhythm.     Heart sounds: Normal heart sounds. No murmur heard. Pulmonary:     Effort: Pulmonary effort is normal.     Breath sounds: Normal breath sounds.  Abdominal:     Palpations: Abdomen is soft.     Tenderness: There is no abdominal tenderness. There is no guarding or rebound.  Musculoskeletal:        General: Normal range of motion.     Cervical back: Normal range of motion.  Lymphadenopathy:     Cervical: No cervical adenopathy.  Neurological:     General: No focal deficit present.     Mental Status: She is alert and oriented to person, place, and time.     Cranial Nerves: No cranial nerve deficit.  Skin:    General: Skin is warm  and dry.  Psychiatric:        Mood and Affect: Mood normal.        Behavior: Behavior normal.        Thought Content: Thought content normal.        Judgment: Judgment normal.  Vitals reviewed.     Assessment/Plan: Encounter for annual routine gynecological examination  Cervical cancer screening - Plan: Cytology - PAP  Screening for HPV (human papillomavirus) - Plan: Cytology - PAP  Yeast vaginitis - Plan: fluconazole (DIFLUCAN) 150 MG tablet; Rx RF eRxd. Add probiotics.    Meds ordered this encounter  Medications   fluconazole (DIFLUCAN) 150 MG tablet    Sig: Take 1 tablet (150 mg total) by mouth once for 1 dose. May repeat in 3 days if still having symptoms    Dispense:  2 tablet    Refill:  2    Order Specific Question:   Supervising Provider    Answer:   Waymon Budge             GYN counsel adequate intake of calcium and vitamin D, diet and exercise     F/U  Return in about 1 year (around 01/16/2024).  Jalesa Thien B. Timika Muench, PA-C 01/16/2023 5:09 PM

## 2023-01-18 ENCOUNTER — Encounter: Payer: Self-pay | Admitting: Obstetrics and Gynecology

## 2023-01-18 LAB — CYTOLOGY - PAP
Comment: NEGATIVE
Diagnosis: NEGATIVE
High risk HPV: NEGATIVE

## 2023-01-18 MED ORDER — METRONIDAZOLE 500 MG PO TABS: ORAL_TABLET | ORAL | 0 refills | Status: DC

## 2023-01-18 NOTE — Addendum Note (Signed)
Addended by: Althea Grimmer B on: 01/18/2023 03:53 PM   Modules accepted: Orders

## 2023-03-15 ENCOUNTER — Ambulatory Visit: Payer: BC Managed Care – PPO

## 2023-03-22 ENCOUNTER — Other Ambulatory Visit (HOSPITAL_COMMUNITY)
Admission: RE | Admit: 2023-03-22 | Discharge: 2023-03-22 | Disposition: A | Discharge status: Home / Self Care | Payer: BC Managed Care – PPO | Source: Ambulatory Visit | Attending: Obstetrics and Gynecology | Admitting: Obstetrics and Gynecology

## 2023-03-22 ENCOUNTER — Ambulatory Visit (INDEPENDENT_AMBULATORY_CARE_PROVIDER_SITE_OTHER): Payer: BC Managed Care – PPO

## 2023-03-22 VITALS — BP 116/74 | HR 66 | Ht 59.0 in | Wt 143.0 lb

## 2023-03-22 DIAGNOSIS — N898 Other specified noninflammatory disorders of vagina: Secondary | ICD-10-CM | POA: Diagnosis not present

## 2023-03-22 DIAGNOSIS — Z113 Encounter for screening for infections with a predominantly sexual mode of transmission: Secondary | ICD-10-CM

## 2023-03-22 DIAGNOSIS — A599 Trichomoniasis, unspecified: Secondary | ICD-10-CM | POA: Insufficient documentation

## 2023-03-22 DIAGNOSIS — R300 Dysuria: Secondary | ICD-10-CM | POA: Diagnosis not present

## 2023-03-22 LAB — POCT URINALYSIS DIPSTICK
Bilirubin, UA: NEGATIVE
Blood, UA: NEGATIVE
Glucose, UA: NEGATIVE
Ketones, UA: NEGATIVE
Leukocytes, UA: NEGATIVE
Nitrite, UA: NEGATIVE
Protein, UA: NEGATIVE
Spec Grav, UA: 1.02 (ref 1.010–1.025)
Urobilinogen, UA: 0.2 E.U./dL
pH, UA: 6 (ref 5.0–8.0)

## 2023-03-22 NOTE — Progress Notes (Signed)
NURSE VISIT NOTE  Subjective:    Patient ID: Rogene Houston, female    DOB: 06/04/1986, 37 y.o.   MRN: [id number]  HPI  Patient is a 37 y.o. G100P2012 female who presents for white, malodorous, and thick vaginal discharge for 2-3 week(s). Denies abnormal vaginal bleeding or significant pelvic pain or fever. admits to dysuria. Patient admits to history of known exposure to STD, TOC for trichomoniasis today.   Objective:    BP 116/74   Pulse 66   Ht 4\' 11"  (1.499 m)   Wt 143 lb (64.9 kg)   BMI 28.88 kg/m     Assessment:   1. Screening for STD (sexually transmitted disease)   2. Trichomoniasis   3. Dysuria       Plan:   GC and chlamydia DNA  probe sent to lab along with urine culture.  Treatment: Await for results for further treatment. ROV prn if symptoms persist or worsen.   Loman Chroman, CMA

## 2023-03-23 LAB — CERVICOVAGINAL ANCILLARY ONLY
Bacterial Vaginitis (gardnerella): POSITIVE — AB
Candida Glabrata: NEGATIVE
Candida Vaginitis: NEGATIVE
Chlamydia: NEGATIVE
Comment: NEGATIVE
Comment: NEGATIVE
Comment: NEGATIVE
Comment: NEGATIVE
Comment: NEGATIVE
Comment: NORMAL
Neisseria Gonorrhea: NEGATIVE
Trichomonas: NEGATIVE

## 2023-03-24 ENCOUNTER — Other Ambulatory Visit: Payer: Self-pay | Admitting: Obstetrics and Gynecology

## 2023-03-24 DIAGNOSIS — A599 Trichomoniasis, unspecified: Secondary | ICD-10-CM

## 2023-03-24 LAB — URINE CULTURE

## 2023-03-24 MED ORDER — METRONIDAZOLE 500 MG PO TABS: 500.0000 mg | ORAL_TABLET | Freq: Two times a day (BID) | ORAL | 0 refills | Status: AC

## 2023-03-24 NOTE — Progress Notes (Signed)
Rx flagyl for BV on culture

## 2023-04-21 ENCOUNTER — Encounter: Payer: Self-pay | Admitting: Obstetrics and Gynecology

## 2023-04-23 ENCOUNTER — Other Ambulatory Visit (HOSPITAL_COMMUNITY)
Admission: RE | Admit: 2023-04-23 | Discharge: 2023-04-23 | Disposition: A | Discharge status: Home / Self Care | Payer: BC Managed Care – PPO | Source: Ambulatory Visit | Attending: Obstetrics and Gynecology | Admitting: Obstetrics and Gynecology

## 2023-04-23 ENCOUNTER — Ambulatory Visit: Payer: BC Managed Care – PPO

## 2023-04-23 VITALS — BP 133/73 | HR 73 | Wt 146.0 lb

## 2023-04-23 DIAGNOSIS — N898 Other specified noninflammatory disorders of vagina: Secondary | ICD-10-CM

## 2023-04-23 NOTE — Progress Notes (Signed)
    NURSE VISIT NOTE  Subjective:    Patient ID: Krista Daniels, female    DOB: 1986/06/26, 37 y.o.   MRN: [id number]  HPI  Patient is a 37 y.o. G66P2012 female who presents for white vaginal discharge with a fishy odor for 3 week(s). Denies abnormal vaginal bleeding or significant pelvic pain or fever. denies  Patient has history of known exposure to STD.   Objective:    BP 133/73   Pulse 73   Wt 146 lb (66.2 kg)   BMI 29.49 kg/m    @THIS  VISIT ONLY@  Assessment:   1. Vaginal discharge     trichomonas  Plan:   GC and chlamydia DNA  probe sent to lab. Treatment: Can start Moinstat 7 day treatment or can wait until results come back. ROV prn if symptoms persist or worsen.   Burtis Junes, CMA

## 2023-04-24 LAB — CERVICOVAGINAL ANCILLARY ONLY
Bacterial Vaginitis (gardnerella): POSITIVE — AB
Candida Glabrata: NEGATIVE
Candida Vaginitis: POSITIVE — AB
Chlamydia: NEGATIVE
Comment: NEGATIVE
Comment: NEGATIVE
Comment: NEGATIVE
Comment: NEGATIVE
Comment: NEGATIVE
Comment: NORMAL
Neisseria Gonorrhea: NEGATIVE
Trichomonas: NEGATIVE

## 2023-04-25 ENCOUNTER — Other Ambulatory Visit: Payer: Self-pay

## 2023-04-25 DIAGNOSIS — B9689 Other specified bacterial agents as the cause of diseases classified elsewhere: Secondary | ICD-10-CM

## 2023-04-25 DIAGNOSIS — B379 Candidiasis, unspecified: Secondary | ICD-10-CM

## 2023-04-25 MED ORDER — METRONIDAZOLE 500 MG PO TABS: 500.0000 mg | ORAL_TABLET | Freq: Two times a day (BID) | ORAL | 0 refills | Status: DC

## 2023-04-25 MED ORDER — FLUCONAZOLE 150 MG PO TABS: 150.0000 mg | ORAL_TABLET | Freq: Every day | ORAL | 0 refills | Status: DC

## 2023-04-25 NOTE — Progress Notes (Signed)
Patient POS for BV and Yeast, okayed by Missy to prescribe diflucan since pt has been prescribed recently.

## 2023-04-27 ENCOUNTER — Telehealth: Payer: Self-pay | Admitting: Obstetrics and Gynecology

## 2023-04-27 NOTE — Telephone Encounter (Signed)
I reached out to patient to get scheduled for a birth control consult. Had to leave message asking to call the office so we can get her scheduled. If patient calls back she want to see ABC. Here are her next openings Nov 5 at 9:15am,10:35am,2:15pm,3:35pm.

## 2023-05-01 NOTE — Telephone Encounter (Signed)
The patient is scheduled on 05/08/23 with ABC

## 2023-05-07 NOTE — Progress Notes (Unsigned)
[place], [facility]   No chief complaint on file.   HPI:      [name] is a 37 y.o. [id number] whose LMP was No LMP recorded., presents today for ***    Patient Active Problem List   Diagnosis Date Noted   OSA (obstructive sleep apnea) 04/24/2021   Post concussion syndrome 05/10/2020   Acne 04/16/2020   Fibromyalgia 04/16/2020   Cardiac murmur 12/30/2019   Abnormal posture 06/04/2019   Bursitis of shoulder 06/04/2019   Edema 06/04/2019   Muscle weakness 06/04/2019   Shoulder joint pain 06/04/2019   Sprain of acromioclavicular ligament 06/04/2019   Anxiety state 08/10/2017   Other insomnia 08/10/2017   Left foot pain 11/04/2015   Left arm pain 03/21/2013   Bee sting allergy 10/07/2012    Past Surgical History:  Procedure Laterality Date   CESAREAN SECTION  2008, 2010    Family History  Problem Relation Age of Onset   Healthy Mother    Heart attack Father 95   Breast cancer Maternal Grandmother 75   Diabetes Maternal Grandmother    Colon cancer Maternal Grandfather 48   Breast cancer Maternal Great-grandmother        early years    Social History   Socioeconomic History   Marital status: Married    Spouse name: Not on file   Number of children: Not on file   Years of education: Not on file   Highest education level: Not on file  Occupational History   Not on file  Tobacco Use   Smoking status: Never   Smokeless tobacco: Never  Vaping Use   Vaping status: Never Used  Substance and Sexual Activity   Alcohol use: Yes    Comment: socially   Drug use: No   Sexual activity: Yes    Birth control/protection: None  Other Topics Concern   Not on file  Social History Narrative   Not on file   Social Determinants of Health   Financial Resource Strain: Low Risk  (07/05/2022)   Received from [facility], [facility]   Overall Financial Resource Strain (CARDIA)    Difficulty of Paying Living Expenses: Not  hard at all  Food Insecurity: No Food Insecurity (07/05/2022)   Received from [facility], [facility]   Hunger Vital Sign    Worried About Running Out of Food in the Last Year: Never true    Ran Out of Food in the Last Year: Never true  Transportation Needs: No Transportation Needs (07/05/2022)   Received from [facility], [facility]   [facility] - Transportation    In the past 12 months, has lack of transportation kept you from medical appointments or from getting medications?: No    Lack of Transportation (Non-Medical): No  Physical Activity: Sufficiently Active (06/27/2018)   Exercise Vital Sign    Days of Exercise per Week: 5 days    Minutes of Exercise per Session: 50 min  Stress: Not on file  Social Connections: Not on file  Intimate Partner Violence: Not on file    Outpatient Medications Prior to Visit  Medication Sig Dispense Refill   albuterol (VENTOLIN HFA) 108 (90 Base) MCG/ACT inhaler Inhale into the lungs.     ARIPiprazole (ABILIFY) 5 MG tablet Take 5 mg by mouth daily.     BELSOMRA 20 MG TABS Take 1 tablet by mouth at bedtime as needed.  buPROPion (WELLBUTRIN SR) 150 MG 12 hr tablet Take 150 mg by mouth 2 (two) times daily.     EPINEPHrine 0.3 mg/0.3 mL IJ SOAJ injection      fluconazole (DIFLUCAN) 150 MG tablet Take 1 tablet (150 mg total) by mouth daily. 1 tablet 0   fluticasone (FLONASE) 50 MCG/ACT nasal spray Place 2 sprays into both nostrils daily.     lithium carbonate (ESKALITH) 450 MG CR tablet Take 450 mg by mouth daily.     LORazepam (ATIVAN) 1 MG tablet Take 1 mg by mouth 2 (two) times daily.     methylphenidate (RITALIN LA) 20 MG 24 hr capsule Take 20 mg by mouth 2 (two) times daily.     metroNIDAZOLE (FLAGYL) 500 MG tablet Take 1 tablet (500 mg total) by mouth 2 (two) times daily. 14 tablet 0   nortriptyline (PAMELOR) 10 MG capsule Take by mouth.     QUVIVIQ 50 MG TABS Take 1 tablet by  mouth daily.     SUMAtriptan (IMITREX) 25 MG tablet Take by mouth.     Vitamin D, Ergocalciferol, (DRISDOL) 1.25 MG (50000 UNIT) CAPS capsule Take 50,000 Units by mouth once a week.     No facility-administered medications prior to visit.      ROS:  Review of Systems BREAST: No symptoms   OBJECTIVE:   Vitals:  There were no vitals taken for this visit.  Physical Exam  Results: No results found for this or any previous visit (from the past 24 hour(s)).   Assessment/Plan: No diagnosis found.    No orders of the defined types were placed in this encounter.     No follow-ups on file.  Aleyssa Pike B. Flo Berroa, PA-C 05/07/2023 5:38 PM

## 2023-05-08 ENCOUNTER — Ambulatory Visit (INDEPENDENT_AMBULATORY_CARE_PROVIDER_SITE_OTHER): Payer: Self-pay | Admitting: Obstetrics and Gynecology

## 2023-05-08 ENCOUNTER — Encounter: Payer: Self-pay | Admitting: Obstetrics and Gynecology

## 2023-05-08 VITALS — BP 104/74 | Ht 59.0 in | Wt 146.0 lb

## 2023-05-08 DIAGNOSIS — Z30011 Encounter for initial prescription of contraceptive pills: Secondary | ICD-10-CM

## 2023-05-08 MED ORDER — DROSPIRENONE-ETHINYL ESTRADIOL 3-0.02 MG PO TABS: 1.0000 | ORAL_TABLET | Freq: Every day | ORAL | 2 refills | Status: AC

## 2023-05-08 NOTE — Patient Instructions (Signed)
I value your feedback and you entrusting us with your care. If you get a [facility] patient survey, I would appreciate you taking the time to let us know about your experience today. Thank you! ? ? ?

## 2023-06-25 ENCOUNTER — Emergency Department: Payer: BC Managed Care – PPO

## 2023-06-25 ENCOUNTER — Other Ambulatory Visit: Payer: Self-pay

## 2023-06-25 ENCOUNTER — Encounter: Payer: Self-pay | Admitting: Medical Oncology

## 2023-06-25 ENCOUNTER — Emergency Department
Admission: EM | Admit: 2023-06-25 | Discharge: 2023-06-25 | Disposition: A | Discharge status: Home / Self Care | Payer: BC Managed Care – PPO | Attending: Emergency Medicine | Admitting: Emergency Medicine

## 2023-06-25 DIAGNOSIS — R102 Pelvic and perineal pain: Secondary | ICD-10-CM | POA: Diagnosis present

## 2023-06-25 LAB — CBC
HCT: 42.5 % (ref 36.0–46.0)
Hemoglobin: 14 g/dL (ref 12.0–15.0)
MCH: 29.8 pg (ref 26.0–34.0)
MCHC: 32.9 g/dL (ref 30.0–36.0)
MCV: 90.4 fL (ref 80.0–100.0)
Platelets: 237 10*3/uL (ref 150–400)
RBC: 4.7 MIL/uL (ref 3.87–5.11)
RDW: 12.8 % (ref 11.5–15.5)
WBC: 4.8 10*3/uL (ref 4.0–10.5)
nRBC: 0 % (ref 0.0–0.2)

## 2023-06-25 LAB — POC URINE PREG, ED: Preg Test, Ur: NEGATIVE

## 2023-06-25 LAB — WET PREP, GENITAL
Clue Cells Wet Prep HPF POC: NONE SEEN
Sperm: NONE SEEN
Trich, Wet Prep: NONE SEEN
WBC, Wet Prep HPF POC: >=10 — AB (ref <10)
Yeast Wet Prep HPF POC: NONE SEEN

## 2023-06-25 LAB — COMPREHENSIVE METABOLIC PANEL
ALT: 12 U/L (ref 0–44)
AST: 18 U/L (ref 15–41)
Albumin: 3.9 g/dL (ref 3.5–5.0)
Alkaline Phosphatase: 68 U/L (ref 38–126)
Anion gap: 12 (ref 5–15)
BUN: 11 mg/dL (ref 6–20)
CO2: 23 mmol/L (ref 22–32)
Calcium: 9 mg/dL (ref 8.9–10.3)
Chloride: 102 mmol/L (ref 98–111)
Creatinine, Ser: 0.75 mg/dL (ref 0.44–1.00)
GFR, Estimated: >60 mL/min (ref >60)
Glucose, Bld: 122 mg/dL — ABNORMAL HIGH (ref 70–99)
Potassium: 3.9 mmol/L (ref 3.5–5.1)
Sodium: 137 mmol/L (ref 135–145)
Total Bilirubin: 0.7 mg/dL (ref <1.2)
Total Protein: 7.3 g/dL (ref 6.5–8.1)

## 2023-06-25 LAB — URINALYSIS, ROUTINE W REFLEX MICROSCOPIC
Bacteria, UA: NONE SEEN
Bilirubin Urine: NEGATIVE
Glucose, UA: NEGATIVE mg/dL
Ketones, ur: NEGATIVE mg/dL
Leukocytes,Ua: NEGATIVE
Nitrite: NEGATIVE
Protein, ur: 100 mg/dL — AB
Specific Gravity, Urine: 1.02 (ref 1.005–1.030)
pH: 5 (ref 5.0–8.0)

## 2023-06-25 LAB — CHLAMYDIA/NGC RT PCR (ARMC ONLY)
Chlamydia Tr: NOT DETECTED
N gonorrhoeae: NOT DETECTED

## 2023-06-25 LAB — LIPASE, BLOOD: Lipase: 32 U/L (ref 11–51)

## 2023-06-25 NOTE — ED Triage Notes (Signed)
Pt reports congestion and pelvic/vaginal pain that began this am. Denies vaginal bleeding.

## 2023-06-25 NOTE — ED Provider Notes (Signed)
[facility] Provider Note    Event Date/Time   First MD Initiated Contact with Patient 06/25/23 [phone]     (approximate)   History   Abdominal Pain and Pelvic Pain   HPI  Krista Daniels is a 37 y.o. female who presents with complaints of left lower quadrant abdominal pain.  Patient reports she has had a cold over the last 3 days, this morning she woke up however with left lower quadrant abdominal discomfort which she reports was moderate in intensity.  She denies dysuria.  No vaginal bleeding.  No history of abdominal surgery besides C-sections     Physical Exam   Triage Vital Signs: ED Triage Vitals [06/25/23 0812]  Encounter Vitals Group     BP (!) 148/79     Systolic BP Percentile      Diastolic BP Percentile      Pulse Rate 68     Resp 18     Temp 99.6 F (37.6 C)     Temp Source Oral     SpO2 100 %     Weight 68 kg (150 lb)     Height 1.499 m (4\' 11" )     Head Circumference      Peak Flow      Pain Score 3     Pain Loc      Pain Education      Exclude from Growth Chart     Most recent vital signs: Vitals:   06/25/23 0812 06/25/23 1242  BP: (!) 148/79 (!) 140/70  Pulse: 68 68  Resp: 18 18  Temp: 99.6 F (37.6 C) 98.1 F (36.7 C)  SpO2: 100% 100%     General: Awake, no distress.  CV:  Good peripheral perfusion.  Resp:  Normal effort.  Abd:  No distention.  Soft, nontender, no CVA tenderness Other:  GU: Thin white discharge, no cervicitis, no CMT   ED Results / Procedures / Treatments   Labs (all labs ordered are listed, but only abnormal results are displayed) Labs Reviewed  WET PREP, GENITAL - Abnormal; Notable for the following components:      Result Value   WBC, Wet Prep HPF POC >=10 (*)    All other components within normal limits  COMPREHENSIVE METABOLIC PANEL - Abnormal; Notable for the following components:   Glucose, Bld 122 (*)    All other components within normal limits  URINALYSIS, ROUTINE W REFLEX  MICROSCOPIC - Abnormal; Notable for the following components:   Color, Urine YELLOW (*)    APPearance CLEAR (*)    Hgb urine dipstick SMALL (*)    Protein, ur 100 (*)    All other components within normal limits  CHLAMYDIA/NGC RT PCR ([facility] ONLY)            LIPASE, BLOOD  CBC  POC URINE PREG, ED     EKG     RADIOLOGY CT renal stone study viewed interpreted by me, no ureterolithiasis, pending radiology review    PROCEDURES:  Critical Care performed:   Procedures   MEDICATIONS ORDERED IN ED: Medications - No data to display   IMPRESSION / MDM / ASSESSMENT AND PLAN / ED COURSE  I reviewed the triage vital signs and the nursing notes. Patient's presentation is most consistent with acute presentation with potential threat to life or bodily function.  Patient presents with left lower quadrant abdominal pain.  Differential includes ureterolithiasis, UTI, diverticulitis, constipation  Overall well-appearing, temperature 99.6, she is quite  congested this is likely related to her upper respiratory infection  Lab work is reassuring, urinalysis demonstrates small amount of hemoglobin, question possible ureterolithiasis, will send for CT renal stone study, pregnancy test is negative  CT scan negative for ureterolithiasis, GU exam with wet prep GC chlamydia, noncontributory  Patient reports she is feeling much better, she would like to follow-up with her GYN, no indication for admission at this time, return precautions discussed, she agrees with this plan.        FINAL CLINICAL IMPRESSION(S) / ED DIAGNOSES   Final diagnoses:  Pelvic pain in female     Rx / DC Orders   ED Discharge Orders     None        Note:  This document was prepared using Dragon voice recognition software and may include unintentional dictation errors.   Jene Every, MD 06/25/23 [phone]

## 2023-08-24 ENCOUNTER — Other Ambulatory Visit (HOSPITAL_COMMUNITY)
Admission: RE | Admit: 2023-08-24 | Discharge: 2023-08-24 | Disposition: A | Discharge status: Home / Self Care | Payer: BC Managed Care – PPO | Source: Ambulatory Visit | Attending: Obstetrics and Gynecology | Admitting: Obstetrics and Gynecology

## 2023-08-24 ENCOUNTER — Ambulatory Visit (INDEPENDENT_AMBULATORY_CARE_PROVIDER_SITE_OTHER): Payer: BC Managed Care – PPO

## 2023-08-24 VITALS — BP 122/53 | HR 64 | Ht 59.0 in | Wt 150.8 lb

## 2023-08-24 DIAGNOSIS — Z7251 High risk heterosexual behavior: Secondary | ICD-10-CM

## 2023-08-24 DIAGNOSIS — Z3202 Encounter for pregnancy test, result negative: Secondary | ICD-10-CM

## 2023-08-24 DIAGNOSIS — N898 Other specified noninflammatory disorders of vagina: Secondary | ICD-10-CM | POA: Diagnosis present

## 2023-08-24 LAB — POCT URINE PREGNANCY: Preg Test, Ur: NEGATIVE

## 2023-08-24 NOTE — Progress Notes (Signed)
    NURSE VISIT NOTE  Subjective:    Patient ID: Krista Daniels, female    DOB: [birth year], 38 y.o.   MRN: [id number]  HPI  Patient is a 38 y.o. [id number] female who presents for white vaginal discharge for 4 day(s). Denies abnormal vaginal bleeding or significant pelvic pain or fever. denies dysuria, hematuria, urinary frequency, urinary urgency, flank pain, abdominal pain, and pelvic pain. Patient has history of known exposure to STD.   Objective:    BP (!) 122/53   Pulse 64   Ht 4\' 11"  (1.499 m)   Wt 150 lb 12.8 oz (68.4 kg)   LMP 07/22/2023 (Exact Date)   BMI 30.46 kg/m      Assessment:   1. Vaginal discharge   2. Unprotected sex     rule out GC or chlamydia and nonspecific vaginitis  Plan:   GC and chlamydia DNA  probe sent to lab. Treatment: abstain from coitus during course of treatment ROV prn if symptoms persist or worsen.   Fonda Kinder, CMA

## 2023-08-24 NOTE — Patient Instructions (Addendum)
 Vaginal Infection (Bacterial Vaginosis): What to Know  Bacterial vaginosis is an infection of the vagina. It happens when the balance of normal germs (bacteria) in the vagina changes. If you don't get treated, it can make it easier for you to get other infections from sex. These are called sexually transmitted infections (STIs). If you're pregnant, you need to get treated right away. This infection can cause a baby to be born early or at a low birth weight. What are the causes? This infection happens when too many harmful germs grow in the vagina. You can't get this infection from toilet seats, bedsheets, swimming pools, or things that touch your vagina. What increases the risk? Having sex with a new person or more than one person. Having sex without protection. Douching. Having an intrauterine device (IUD). Smoking. Using drugs or drinking alcohol. These can lead you to do risky things. Taking certain antibiotics. Being pregnant. What are the signs or symptoms? Some females have no symptoms. Symptoms may include: A gray or white discharge from your vagina. It can be watery or foamy. A fishy smell. This can happen after sex or during your menstrual period. Itching in and around your vagina. Burning or pain when you pee. How is this treated? This infection is treated with antibiotics. These may be given to you as: A pill. A cream for your vagina. A medicine that you put into your vagina (suppository). If the infection comes back, you may need more antibiotics. Follow these instructions at home: Medicines Take your medicines as told. Take or use your antibiotics as told. Do not stop using them even if you start to feel better. General instructions If the person you have sex with is a female, tell her that you have this infection. She will need to follow up with her doctor. Female partners don't need to be treated. Do not have sex until you finish treatment. Drink more fluids as  told. Keep your vagina and butt clean. Wash these areas with warm water each day. Wipe from front to back after you poop. If you're breastfeeding a baby, talk to your doctor if you should keep doing so during treatment. How is this prevented? Self-care Do not douche. Do not use deodorant sprays on your vagina. Wear cotton underwear. Do not wear tight pants and pantyhose, especially in the summer. Safe sex Use condoms the correct way and every time you have sex. Use dental dams to protect yourself during oral sex. Limit how many people you have sex with. Get tested for STIs. The person you have sex with should also get tested. Drugs and alcohol Do not smoke, vape, or use nicotine or tobacco. Do not use drugs. Limit the amount of alcohol you drink because it can lead you to do risky things. Where to find more information To learn more: Go to TonerPromos.no. Click Health Topics A-Z. Type "bacterial vaginosis" in the search bar. American Sexual Health Association (ASHA): ashasexualhealth.org U.S. Department of Health and [employer], Office on Women's Health: TravelLesson.ca Contact a doctor if: Your symptoms don't get better, even after treatment. You have more discharge or pain when you pee. You have a fever or chills. You have pain in your belly or in the area between your hips. You have pain during sex. You bleed from your vagina between menstrual periods. This information is not intended to replace advice given to you by your health care provider. Make sure you discuss any questions you have with your health care provider. Document  Revised: 12/06/2022 Document Reviewed: 12/06/2022 Elsevier Patient Education  2024 [employer].

## 2023-08-27 LAB — CERVICOVAGINAL ANCILLARY ONLY
Bacterial Vaginitis (gardnerella): POSITIVE — AB
Candida Glabrata: NEGATIVE
Candida Vaginitis: NEGATIVE
Chlamydia: NEGATIVE
Comment: NEGATIVE
Comment: NEGATIVE
Comment: NEGATIVE
Comment: NEGATIVE
Comment: NEGATIVE
Comment: NORMAL
Neisseria Gonorrhea: NEGATIVE
Trichomonas: NEGATIVE

## 2023-08-28 ENCOUNTER — Other Ambulatory Visit: Payer: Self-pay

## 2023-08-28 DIAGNOSIS — B9689 Other specified bacterial agents as the cause of diseases classified elsewhere: Secondary | ICD-10-CM

## 2023-08-28 MED ORDER — METRONIDAZOLE 500 MG PO TABS: 500.0000 mg | ORAL_TABLET | Freq: Two times a day (BID) | ORAL | 0 refills | Status: DC

## 2023-10-20 ENCOUNTER — Other Ambulatory Visit: Payer: Self-pay | Admitting: Obstetrics and Gynecology

## 2023-10-20 DIAGNOSIS — B9689 Other specified bacterial agents as the cause of diseases classified elsewhere: Secondary | ICD-10-CM

## 2023-10-20 DIAGNOSIS — B3731 Acute candidiasis of vulva and vagina: Secondary | ICD-10-CM

## 2023-10-22 MED ORDER — METRONIDAZOLE 500 MG PO TABS: 500.0000 mg | ORAL_TABLET | Freq: Two times a day (BID) | ORAL | 0 refills | Status: DC

## 2023-11-28 ENCOUNTER — Encounter: Payer: Self-pay | Admitting: Emergency Medicine

## 2023-11-28 ENCOUNTER — Other Ambulatory Visit: Payer: Self-pay

## 2023-11-28 ENCOUNTER — Emergency Department

## 2023-11-28 DIAGNOSIS — R072 Precordial pain: Secondary | ICD-10-CM | POA: Insufficient documentation

## 2023-11-28 DIAGNOSIS — R001 Bradycardia, unspecified: Secondary | ICD-10-CM | POA: Diagnosis not present

## 2023-11-28 NOTE — ED Triage Notes (Signed)
 Patient ambulatory to triage with steady gait, without difficulty or distress noted; pt reports midsternal CP radiating under left breast and into axillae tonight with no accomp symptoms; denies hx of same

## 2023-11-29 ENCOUNTER — Emergency Department
Admission: EM | Admit: 2023-11-29 | Discharge: 2023-11-29 | Disposition: A | Discharge status: Home / Self Care | Attending: Emergency Medicine | Admitting: Emergency Medicine

## 2023-11-29 DIAGNOSIS — R079 Chest pain, unspecified: Secondary | ICD-10-CM

## 2023-11-29 LAB — HEPATIC FUNCTION PANEL
ALT: 6 U/L (ref 0–44)
AST: 11 U/L — ABNORMAL LOW (ref 15–41)
Albumin: 3.6 g/dL (ref 3.5–5.0)
Alkaline Phosphatase: 47 U/L (ref 38–126)
Bilirubin, Direct: <0.1 mg/dL (ref 0.0–0.2)
Total Bilirubin: 0.6 mg/dL (ref 0.0–1.2)
Total Protein: 6.6 g/dL (ref 6.5–8.1)

## 2023-11-29 LAB — CBC
HCT: 36.1 % (ref 36.0–46.0)
Hemoglobin: 12.4 g/dL (ref 12.0–15.0)
MCH: 30 pg (ref 26.0–34.0)
MCHC: 34.3 g/dL (ref 30.0–36.0)
MCV: 87.4 fL (ref 80.0–100.0)
Platelets: 244 10*3/uL (ref 150–400)
RBC: 4.13 MIL/uL (ref 3.87–5.11)
RDW: 13.2 % (ref 11.5–15.5)
WBC: 5.6 10*3/uL (ref 4.0–10.5)
nRBC: 0 % (ref 0.0–0.2)

## 2023-11-29 LAB — BASIC METABOLIC PANEL WITH GFR
Anion gap: 9 (ref 5–15)
BUN: 10 mg/dL (ref 6–20)
CO2: 22 mmol/L (ref 22–32)
Calcium: 9.4 mg/dL (ref 8.9–10.3)
Chloride: 106 mmol/L (ref 98–111)
Creatinine, Ser: 0.8 mg/dL (ref 0.44–1.00)
GFR, Estimated: >60 mL/min (ref >60)
Glucose, Bld: 105 mg/dL — ABNORMAL HIGH (ref 70–99)
Potassium: 3.4 mmol/L — ABNORMAL LOW (ref 3.5–5.1)
Sodium: 137 mmol/L (ref 135–145)

## 2023-11-29 LAB — TROPONIN I (HIGH SENSITIVITY)
Troponin I (High Sensitivity): 6 ng/L (ref <18)
Troponin I (High Sensitivity): 6 ng/L (ref <18)

## 2023-11-29 LAB — D-DIMER, QUANTITATIVE: D-Dimer, Quant: <0.27 ug{FEU}/mL (ref 0.00–0.50)

## 2023-11-29 LAB — LIPASE, BLOOD: Lipase: 51 U/L (ref 11–51)

## 2023-11-29 NOTE — ED Provider Notes (Signed)
 [facility] Provider Note    Event Date/Time   First MD Initiated Contact with Patient 11/29/23 0215     (approximate)   History   Chest Pain   HPI  Krista Daniels is a 38 y.o. female   Past medical history of anxiety, ADHD, bipolar who presents emergency department with chest pain.  Worse with deep breaths.  Started at 9 PM while resting and watching TV.  No respiratory symptoms like shortness of breath, or infectious symptoms like cough congestion or fever.  It starts substernally and radiates up to the left chest.  No skin changes.    She has never had a blood clot and does not have any unilateral leg symptoms.  She has had no immobilization.  She does take oral contraceptives.    Denies abdominal pain.  No other acute medical complaints.    External Medical Documents Reviewed: Outpatient notes from gynecology visit earlier this year      Physical Exam   Triage Vital Signs: ED Triage Vitals  Encounter Vitals Group     BP 11/28/23 2348 (!) 145/75     Systolic BP Percentile --      Diastolic BP Percentile --      Pulse Rate 11/28/23 2348 72     Resp 11/28/23 2348 17     Temp 11/28/23 2348 98.2 F (36.8 C)     Temp Source 11/28/23 2348 Oral     SpO2 11/28/23 2348 100 %     Weight 11/28/23 2335 136 lb (61.7 kg)     Height 11/28/23 2335 4\' 11"  (1.499 m)     Head Circumference --      Peak Flow --      Pain Score 11/28/23 2333 5     Pain Loc --      Pain Education --      Exclude from Growth Chart --     Most recent vital signs: Vitals:   11/29/23 0159 11/29/23 0300  BP: 134/72 121/68  Pulse: (!) 57 (!) 56  Resp: 17 20  Temp: 98 F (36.7 C) 98 F (36.7 C)  SpO2: 100% 100%    General: Awake, no distress.  CV:  Good peripheral perfusion.  Resp:  Normal effort.  Abd:  No distention.  Other:   Awake alert comfortable slightly bradycardic otherwise vital signs normal, normotensive, breathing comfortably on room air no hypoxemia.   Clear lungs and heart sounds normal rate and rhythm with no murmur.  Soft benign abdominal exam deep palpation all quadrants.  No chest wall tenderness.  No skin rash noted.  Radial pulses intact and equal bilaterally.   ED Results / Procedures / Treatments   Labs (all labs ordered are listed, but only abnormal results are displayed) Labs Reviewed  BASIC METABOLIC PANEL WITH GFR - Abnormal; Notable for the following components:      Result Value   Potassium 3.4 (*)    Glucose, Bld 105 (*)    All other components within normal limits  HEPATIC FUNCTION PANEL - Abnormal; Notable for the following components:   AST 11 (*)    All other components within normal limits  CBC  LIPASE, BLOOD  D-DIMER, QUANTITATIVE  TROPONIN I (HIGH SENSITIVITY)  TROPONIN I (HIGH SENSITIVITY)     I ordered and reviewed the above labs they are notable for cell counts electrolytes unremarkable.  Serial troponins negative.  D-dimer negative.  EKG ED ECG REPORT I, Buell Carmin, the attending physician,  personally viewed and interpreted this ECG.   Date: 11/29/2023  EKG Time: 2345  Rate: 67  Rhythm: sinus  Axis: nl  Intervals:nl  ST&T Change: no stemi    RADIOLOGY I independently reviewed and interpreted chest x-ray and I see no obvious pneumothorax or focality I also reviewed radiologist's formal read.   PROCEDURES:  Critical Care performed: No  Procedures   MEDICATIONS ORDERED IN ED: Medications - No data to display   IMPRESSION / MDM / ASSESSMENT AND PLAN / ED COURSE  I reviewed the triage vital signs and the nursing notes.                                Patient's presentation is most consistent with acute presentation with potential threat to life or bodily function.  Differential diagnosis includes, but is not limited to, ACS, dissection, PE, costochondritis, pneumothorax, respiratory infection   The patient is on the cardiac monitor to evaluate for evidence of arrhythmia and/or  significant heart rate changes.  MDM:    Nonspecific substernal/left-sided chest pain nonexertional, very atypical for ACS.  EKG nonischemic, serial troponins been flat so I doubt ACS.  Consider PE given pleuritic nature of pain on oral contraceptives, though low pretest probability given no shortness of breath, normal hemodynamics, atypical symptoms, so proceeded with D-dimer which is negative, after which I think the possibility of PE is very low.  No respiratory infectious symptoms suggest infection, normal white blood cell count and clear chest x-ray I doubt infection.  Given her overall stability and negative workup as above, I doubt cardiopulmonary emergency at this time.  Discharged with close follow-up PMD, anticipatory guidance and return precautions given.       FINAL CLINICAL IMPRESSION(S) / ED DIAGNOSES   Final diagnoses:  Nonspecific chest pain     Rx / DC Orders   ED Discharge Orders     None        Note:  This document was prepared using Dragon voice recognition software and may include unintentional dictation errors.    Buell Carmin, MD 11/29/23 [phone]

## 2023-11-29 NOTE — Discharge Instructions (Signed)
 Fortunately we did not find any emergency conditions that accounted for your chest pain tonight.  Take acetaminophen 650 mg and ibuprofen 400 mg every 6 hours for pain.  Take with food. Thank you for choosing us  for your health care today!  Please see your primary doctor this week for a follow up appointment.   If you have any new, worsening, or unexpected symptoms call your doctor right away or come back to the emergency department for reevaluation.  It was my pleasure to care for you today.   Arron Large Margery Sheets, MD

## 2023-11-29 NOTE — ED Notes (Signed)
 ED Provider at bedside.

## 2024-02-16 ENCOUNTER — Other Ambulatory Visit: Payer: Self-pay | Admitting: Obstetrics and Gynecology

## 2024-02-16 ENCOUNTER — Encounter: Payer: Self-pay | Admitting: Obstetrics and Gynecology

## 2024-02-16 DIAGNOSIS — B9689 Other specified bacterial agents as the cause of diseases classified elsewhere: Secondary | ICD-10-CM

## 2024-02-18 MED ORDER — METRONIDAZOLE 500 MG PO TABS: 500.0000 mg | ORAL_TABLET | Freq: Two times a day (BID) | ORAL | 0 refills | Status: AC

## 2024-02-18 NOTE — Telephone Encounter (Signed)
Yes, pls refill. Thx.

## 2024-05-28 ENCOUNTER — Ambulatory Visit
Admission: EM | Admit: 2024-05-28 | Discharge: 2024-05-28 | Disposition: A | Discharge status: Home / Self Care | Attending: Family Medicine | Admitting: Family Medicine

## 2024-05-28 DIAGNOSIS — R300 Dysuria: Secondary | ICD-10-CM | POA: Insufficient documentation

## 2024-05-28 DIAGNOSIS — N3001 Acute cystitis with hematuria: Secondary | ICD-10-CM | POA: Insufficient documentation

## 2024-05-28 LAB — POCT URINE DIPSTICK
Bilirubin, UA: NEGATIVE
Glucose, UA: 100 mg/dL — AB
Nitrite, UA: POSITIVE — AB
Protein Ur, POC: 30 mg/dL — AB
Spec Grav, UA: 1.02 (ref 1.010–1.025)
Urobilinogen, UA: 1 U/dL
pH, UA: 7 (ref 5.0–8.0)

## 2024-05-28 MED ORDER — NITROFURANTOIN MONOHYD MACRO 100 MG PO CAPS: 100.0000 mg | ORAL_CAPSULE | Freq: Two times a day (BID) | ORAL | 0 refills | Status: AC

## 2024-05-28 NOTE — Discharge Instructions (Addendum)
 You have a urinary tract infection. I sent your urine for culture to be sure the antibiotic prescribed will treat your infection. Someone may call you to change antibiotics. Stop by the pharmacy to pick up your prescriptions.  Follow up with your primary care provider or return to the urgent care, if not improving.

## 2024-05-28 NOTE — ED Provider Notes (Signed)
 [facility]    CSN: [id number] Arrival date & time: 05/28/24  1808      History   Chief Complaint Chief Complaint  Patient presents with   Urinary Frequency   Dysuria     HPI HPI [name] is a 38 y.o. female.    Krista Daniels presents for urinary frequency, hematuria and dysuria that started 4 days ago .  Tried AZO prior to arrival without relief.  Denies known STI exposure.  Lyfe does not use condoms regularly. She is  not currently pregnant.  Patient's last menstrual period was 05/13/2024 (exact date).    - Abnormal vaginal discharge: no - vaginal odor: no - vaginal bleeding: no - Dysuria: yes  - Hematuria: yes - Urinary urgency: yes  - Urinary frequency: yes   - Fever: no - Abdominal pain: no - Pelvic pain: no - Rash/Skin lesions/mouth ulcers: no - Nausea: no  - Vomiting: no  - Back Pain: no        Past Medical History:  Diagnosis Date   ADHD    Anxiety    Anxiety    Bipolar 1 disorder (HCC)    BV (bacterial vaginosis)    Yeast vaginitis     Patient Active Problem List   Diagnosis Date Noted   OSA (obstructive sleep apnea) 04/24/2021   Post concussion syndrome 05/10/2020   Acne 04/16/2020   Fibromyalgia 04/16/2020   Cardiac murmur 12/30/2019   Abnormal posture 06/04/2019   Bursitis of shoulder 06/04/2019   Edema 06/04/2019   Muscle weakness 06/04/2019   Shoulder joint pain 06/04/2019   Sprain of acromioclavicular ligament 06/04/2019   Anxiety state 08/10/2017   Other insomnia 08/10/2017   Left foot pain 11/04/2015   Left arm pain 03/21/2013   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 Authorizing Provider  nitrofurantoin , macrocrystal-monohydrate, (MACROBID ) 100 MG capsule Take 1 capsule (100 mg total) by mouth 2 (two) times daily. 05/28/24  Yes Helen Cuff, DO  buPROPion (WELLBUTRIN SR) 150 MG 12 hr tablet Take 150 mg by mouth 2 (two) times daily. 12/17/21   [provider]  ciprofloxacin  (CIPRO ) 500 MG tablet Take 1 tablet (500 mg total) by mouth 2 (two) times daily for 7 days. 05/31/24 06/07/24  Blair, Diane W, FNP  drospirenone -ethinyl estradiol  (YAZ) 3-0.02 MG tablet Take 1 tablet by mouth daily. 05/08/23   Copland, Alicia B, PA-C  EPINEPHrine  0.3 mg/0.3 mL IJ SOAJ injection  02/20/20   [provider]  lithium  carbonate (ESKALITH) 450 MG CR tablet Take 450 mg by mouth daily. 11/25/21   [provider]  lithium  carbonate (LITHOBID) 300 MG ER tablet Take 300 mg by mouth at bedtime. 04/18/23   [provider]  LORazepam  (ATIVAN ) 1 MG tablet Take 1 mg by mouth 2 (two) times daily.    [provider]  methylphenidate (RITALIN LA) 20 MG 24 hr capsule Take 20 mg by mouth 2 (two) times daily. 06/09/21   [provider]  metroNIDAZOLE  (FLAGYL ) 500 MG tablet Take 1 tablet (500 mg total) by mouth 2 (two) times daily. 02/18/24   Copland, Alicia B, PA-C  Phendimetrazine Tartrate 35 MG TABS Take 1 tablet by mouth 3 (three) times daily.    [provider]  QUVIVIQ 50 MG TABS Take 1 tablet by mouth daily.    [provider]  spironolactone (ALDACTONE) 25 MG tablet     [provider]  SUMAtriptan (IMITREX) 25 MG tablet Take by mouth. 07/26/20   [provider]    Family History Family History  Problem Relation Age of Onset   Healthy Mother    Heart attack Father 79   Breast cancer Maternal Grandmother 51   Diabetes Maternal Grandmother    Colon cancer Maternal Grandfather 52   Breast cancer Maternal Great-grandmother        early years    Social History Social  History   Tobacco Use   Smoking status: Never   Smokeless tobacco: Never  Vaping Use   Vaping status: Never Used  Substance Use Topics   Alcohol use: Yes    Comment: socially   Drug use: No     Allergies   Lamotrigine, Bee venom, Erythromycin, Hydrocodone, and Oxycodone   Review of Systems Review of Systems: :negative unless otherwise stated in HPI.      Physical Exam Triage Vital Signs ED Triage Vitals  Encounter Vitals Group     BP 05/28/24 1815 127/72     Girls Systolic BP Percentile --      Girls Diastolic BP Percentile --      Boys Systolic BP Percentile --      Boys Diastolic BP Percentile --      Pulse Rate 05/28/24 1815 74     Resp 05/28/24 1815 18     Temp 05/28/24 1815 98.7 F (37.1 C)     Temp Source 05/28/24 1815 Oral     SpO2 05/28/24 1815 96 %     Weight 05/28/24 1814 140 lb (63.5 kg)     Height --      Head Circumference --      Peak Flow --      Pain Score 05/28/24 1814 0     Pain Loc --      Pain Education --      Exclude from Growth Chart --    No data found.  Updated Vital Signs BP 127/72 (BP Location: Right Arm)   Pulse 74   Temp 98.7 F (37.1 C) (Oral)   Resp 18   Wt 63.5 kg   LMP 05/13/2024 (Exact Date)   SpO2 96%   BMI 28.28 kg/m   Visual Acuity Right Eye Distance:   Left Eye Distance:   Bilateral Distance:    Right Eye Near:   Left Eye Near:    Bilateral Near:     Physical Exam GEN: well appearing female in no acute distress  CVS: well perfused  RESP: speaking in full sentences without pause     UC Treatments / Results  Labs (all labs ordered are listed, but only abnormal results are displayed) Labs Reviewed  POCT URINE DIPSTICK - Abnormal; Notable for the following components:      Result Value   Clarity, UA cloudy (*)    Glucose, UA =100 (*)    Ketones,  POC UA trace (5) (*)    Blood, UA moderate (*)    Protein Ur, POC =30 (*)    Nitrite, UA Positive (*)    Leukocytes, UA Small (1+) (*)    All other  components within normal limits  URINE CULTURE    EKG   Radiology No results found.  Procedures Procedures (including critical care time)  Medications Ordered in UC Medications - No data to display  Initial Impression / Assessment and Plan / UC Course  I have reviewed the triage vital signs and the nursing notes.  Pertinent labs & imaging results that were available during my care of the patient were reviewed by me and considered in my medical decision making (see chart for details).    Acute cystitis:  Patient is a 38 y.o. female  who presents for 4 days of dysuria and  hematuria.  Overall patient is well-appearing and afebrile.  Vital signs stable.   POC urine dipstick consistent with acute cystitis.   Hematuria  present but no  microscopy available.   Treat with Macrobid  2 times daily for 5 days.  Urine culture obtained.  Follow-up sensitivities and change antibiotics, if needed.   Return precautions including abdominal pain, fever, chills, nausea, or vomiting given. Follow-up,  if symptoms not improving or getting worse. Discussed MDM, treatment plan and plan for follow-up with patient who agrees with plan.        Final Clinical Impressions(s) / UC Diagnoses   Final diagnoses:  Dysuria  Acute cystitis with hematuria     Discharge Instructions      You have a urinary tract infection. I sent your urine for culture to be sure the antibiotic prescribed will treat your infection. Someone may call you to change antibiotics. Stop by the pharmacy to pick up your prescriptions.  Follow up with your primary care provider or return to the urgent care, if not improving.     ED Prescriptions     Medication Sig Dispense Auth. Provider   nitrofurantoin , macrocrystal-monohydrate, (MACROBID ) 100 MG capsule Take 1 capsule (100 mg total) by mouth 2 (two) times daily. 14 capsule Neftaly Inzunza, DO      PDMP not reviewed this encounter.   Nikiyah Fackler, DO 06/07/24  1449

## 2024-05-28 NOTE — ED Triage Notes (Signed)
 Sx x 3 days  Urinary frequency Dysuria

## 2024-05-29 LAB — URINE CULTURE: Culture: NO GROWTH

## 2024-05-31 ENCOUNTER — Telehealth: Admitting: Family Medicine

## 2024-05-31 DIAGNOSIS — N3001 Acute cystitis with hematuria: Secondary | ICD-10-CM

## 2024-05-31 MED ORDER — CIPROFLOXACIN HCL 500 MG PO TABS: 500.0000 mg | ORAL_TABLET | Freq: Two times a day (BID) | ORAL | 0 refills | Status: AC

## 2024-05-31 NOTE — Patient Instructions (Signed)

## 2024-05-31 NOTE — Progress Notes (Signed)
 Virtual Visit Consent   [name], you are scheduled for a virtual visit with a [facility] provider today. Just as with appointments in the office, your consent must be obtained to participate. Your consent will be active for this visit and any virtual visit you may have with one of our providers in the next 365 days. If you have a MyChart account, a copy of this consent can be sent to you electronically.  As this is a virtual visit, video technology does not allow for your provider to perform a traditional examination. This may limit your provider's ability to fully assess your condition. If your provider identifies any concerns that need to be evaluated in person or the need to arrange testing (such as labs, EKG, etc.), we will make arrangements to do so. Although advances in technology are sophisticated, we cannot ensure that it will always work on either your end or our end. If the connection with a video visit is poor, the visit may have to be switched to a telephone visit. With either a video or telephone visit, we are not always able to ensure that we have a secure connection.  By engaging in this virtual visit, you consent to the provision of healthcare and authorize for your insurance to be billed (if applicable) for the services provided during this visit. Depending on your insurance coverage, you may receive a charge related to this service.  I need to obtain your verbal consent now. Are you willing to proceed with your visit today? Jaclin Finks has provided verbal consent on 05/31/2024 for a virtual visit (video or telephone). Loa Lamp, FNP  Date: 05/31/2024 2:35 PM   Virtual Visit via Video Note   I, Loa Lamp, connected with  Krista Daniels  ([id number], January 05, 1986) on 05/31/24 at  2:30 PM EST by a video-enabled telemedicine application and verified that I am speaking with the correct person using two identifiers.  Location: Patient: Virtual Visit Location Patient:  Home Provider: Virtual Visit Location Provider: Home Office   I discussed the limitations of evaluation and management by telemedicine and the availability of in person appointments. The patient expressed understanding and agreed to proceed.    History of Present Illness: Krista Daniels is a 38 y.o. who identifies as a female who was assigned female at birth, and is being seen today for persistent burning and frequency on urination. No fever. Seen at [facility] 05/28/24 placed on macrobid  with neg urine culture. No improvement in sx.   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/2012    Allergies:  Allergies  Allergen Reactions   Lamotrigine Rash    lamictal   Bee Venom    Erythromycin    Hydrocodone     Other Reaction(s): Not available   Oxycodone    Medications:  Current Outpatient Medications:    ciprofloxacin  (CIPRO ) 500 MG tablet, Take 1 tablet (500 mg total) by mouth 2 (two) times daily for 7 days.,  Disp: 14 tablet, Rfl: 0   buPROPion (WELLBUTRIN SR) 150 MG 12 hr tablet, Take 150 mg by mouth 2 (two) times daily., Disp: , Rfl:    drospirenone -ethinyl estradiol  (YAZ) 3-0.02 MG tablet, Take 1 tablet by mouth daily., Disp: 84 tablet, Rfl: 2   EPINEPHrine  0.3 mg/0.3 mL IJ SOAJ injection, , Disp: , Rfl:    lithium  carbonate (ESKALITH) 450 MG CR tablet, Take 450 mg by mouth daily., Disp: , Rfl:    lithium  carbonate (LITHOBID) 300 MG ER tablet, Take 300 mg by mouth at bedtime.,  Disp: , Rfl:    LORazepam  (ATIVAN ) 1 MG tablet, Take 1 mg by mouth 2 (two) times daily., Disp: , Rfl:    methylphenidate (RITALIN LA) 20 MG 24 hr capsule, Take 20 mg by mouth 2 (two) times daily., Disp: , Rfl:    metroNIDAZOLE  (FLAGYL ) 500 MG tablet, Take 1 tablet (500 mg total) by mouth 2 (two) times daily., Disp: 14 tablet, Rfl: 0   nitrofurantoin , macrocrystal-monohydrate, (MACROBID ) 100 MG capsule, Take 1 capsule (100 mg total) by mouth 2 (two) times daily., Disp: 14 capsule, Rfl: 0   Phendimetrazine Tartrate 35 MG TABS, Take 1 tablet by mouth 3 (three) times daily., Disp: , Rfl:    QUVIVIQ 50 MG TABS, Take 1 tablet by mouth daily., Disp: , Rfl:    spironolactone (ALDACTONE) 25 MG tablet, , Disp: , Rfl:    SUMAtriptan (IMITREX) 25 MG tablet, Take by mouth., Disp: , Rfl:   Observations/Objective: Patient is well-developed, well-nourished in no acute distress.  Resting comfortably  at home.  Head is normocephalic, atraumatic.  No labored breathing.  Speech is clear and coherent with logical content.  Patient is alert and oriented at baseline.    Assessment and Plan: There are no diagnoses linked to this encounter. Increase fluids, go back to UC to repeat culture in 3 days if sx persist.   Follow Up Instructions: I discussed the assessment and treatment plan with the patient. The patient was provided an opportunity to ask questions and all were answered. The patient agreed with the plan and demonstrated an understanding of the instructions.  A copy of instructions were sent to the patient via MyChart unless otherwise noted below.     The patient was advised to call back or seek an in-person evaluation if the symptoms worsen or if the condition fails to improve as anticipated.    [name], FNP
